# Patient Record
Sex: Female | Born: 1990
Health system: Southern US, Community
[De-identification: ages and names within clinical notes are randomized; demographics above are authoritative.]

## PROBLEM LIST (undated history)

## (undated) DIAGNOSIS — K811 Chronic cholecystitis: Secondary | ICD-10-CM

## (undated) DIAGNOSIS — N83202 Unspecified ovarian cyst, left side: Secondary | ICD-10-CM

## (undated) DIAGNOSIS — R748 Abnormal levels of other serum enzymes: Secondary | ICD-10-CM

## (undated) DIAGNOSIS — R51 Headache: Secondary | ICD-10-CM

## (undated) DIAGNOSIS — M542 Cervicalgia: Secondary | ICD-10-CM

## (undated) DIAGNOSIS — F419 Anxiety disorder, unspecified: Secondary | ICD-10-CM

## (undated) HISTORY — DX: Anxiety disorder, unspecified: F41.9

## (undated) HISTORY — DX: Cervicalgia: M54.2

## (undated) HISTORY — DX: Headache: R51

## (undated) HISTORY — DX: Chronic cholecystitis: K81.1

## (undated) HISTORY — DX: Abnormal levels of other serum enzymes: R74.8

## (undated) HISTORY — DX: Unspecified ovarian cyst, left side: N83.202

---
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
## 2012-01-30 ENCOUNTER — Telehealth: Payer: Self-pay | Admitting: Family Medicine

## 2012-01-30 NOTE — Telephone Encounter (Signed)
Letter put in front cabinet and message left on patients voicemail stating letter was ready to be picked up.

## 2012-03-27 ENCOUNTER — Ambulatory Visit (INDEPENDENT_AMBULATORY_CARE_PROVIDER_SITE_OTHER): Payer: 59

## 2012-03-27 DIAGNOSIS — Z23 Encounter for immunization: Secondary | ICD-10-CM

## 2012-03-27 NOTE — Progress Notes (Signed)
  Subjective:    Patient ID: Amber Mitchell, female    DOB: 1991-01-31, 21 y.o.   MRN: [id number]  HPI    Review of Systems     Objective:   Physical Exam        Assessment & Plan:  Patient came in for 1st HPV and Hep B injection. Patient tolerated both injections

## 2012-04-02 ENCOUNTER — Emergency Department
Admission: EM | Admit: 2012-04-02 | Discharge: 2012-04-02 | Disposition: A | Discharge status: Home / Self Care | Payer: 59 | Source: Home / Self Care | Attending: Family Medicine | Admitting: Family Medicine

## 2012-04-02 ENCOUNTER — Encounter: Payer: Self-pay | Admitting: *Deleted

## 2012-04-02 DIAGNOSIS — J069 Acute upper respiratory infection, unspecified: Secondary | ICD-10-CM

## 2012-04-02 DIAGNOSIS — J029 Acute pharyngitis, unspecified: Secondary | ICD-10-CM

## 2012-04-02 LAB — POCT RAPID STREP A (OFFICE): Rapid Strep A Screen: NEGATIVE

## 2012-04-02 MED ORDER — AZITHROMYCIN 250 MG PO TABS: ORAL_TABLET | ORAL | Status: AC

## 2012-04-02 MED ORDER — BENZONATATE 200 MG PO CAPS: 200.0000 mg | ORAL_CAPSULE | Freq: Every day | ORAL | Status: AC

## 2012-04-02 NOTE — ED Notes (Signed)
Patient c/o throat pain and right ear pain x yesterday.

## 2012-04-02 NOTE — ED Provider Notes (Signed)
History     CSN: [id number]  Arrival date & time 04/02/12  [facility]   First MD Initiated Contact with Patient 04/02/12 1954      Chief Complaint  Patient presents with  . Sore Throat  . Otalgia    right     HPI Comments: Patient complains of 1 day history of gradually progressive URI symptoms beginning with a mild sore throat, nasal congestion, and a cough.  Complains of fatigue but no myalgias.  Cough is generally non-productive.  There has been no pleuritic pain, shortness of breath, or wheezes.   The history is provided by the patient.    Past Medical History  Diagnosis Date  . Chicken pox as a child  . Preventative health care 12/26/2011  . Overweight 12/26/2011    History reviewed. No pertinent past surgical history.  Family History  Problem Relation Age of Onset  . Hypertension Maternal Grandmother     History  Substance Use Topics  . Smoking status: Never Smoker   . Smokeless tobacco: Never Used  . Alcohol Use: No    OB History    Grav Para Term Preterm Abortions TAB SAB Ect Mult Living                  Review of Systems + sore throat + cough No pleuritic pain No wheezing + nasal congestion + post-nasal drainage No sinus pain/pressure No itchy/red eyes + right earache No hemoptysis No SOB + fever, + chills No nausea No vomiting No abdominal pain No diarrhea No urinary symptoms No skin rashes + fatigue No myalgias + headache Used OTC meds without relief  Allergies  Review of patient's allergies indicates no known allergies.  Home Medications   Current Outpatient Rx  Name Route Sig Dispense Refill  . AZITHROMYCIN 250 MG PO TABS  Take 2 tabs today; then begin one tab once daily for 4 more days. 6 each 0    (Rx void after 04/10/12)  . BENZONATATE 200 MG PO CAPS Oral Take 1 capsule (200 mg total) by mouth at bedtime. Take as needed for cough 12 capsule 0    BP 115/78  Pulse 102  Temp(Src) 99.3 F (37.4 C) (Oral)  Resp 16  Ht 5' 7.75"  (1.721 m)  Wt 196 lb (88.905 kg)  BMI 30.02 kg/m2  SpO2 100%  Physical Exam Nursing notes and Vital Signs reviewed. Appearance:  Patient appears healthy, stated age, and in no acute distress.  Patient is obese (BMI 30.1)  Eyes:  Pupils are equal, round, and reactive to light and accomodation.  Extraocular movement is intact.  Conjunctivae are not inflamed  Ears:  Canals normal.  Tympanic membranes normal.  Nose:  Mildly congested turbinates.  No sinus tenderness.   Pharynx:  Normal Neck:  Supple.  Slightly tender shotty anterior/posterior nodes are palpated bilaterally  Lungs:  Clear to auscultation.  Breath sounds are equal.  Heart:  Regular rate and rhythm without murmurs, rubs, or gallops.  Abdomen:  Nontender without masses or hepatosplenomegaly.  Bowel sounds are present.  No CVA or flank tenderness.  Extremities:  No edema.  No calf tenderness Skin:  No rash present.   ED Course  Procedures  none   Labs Reviewed  POCT RAPID STREP A (OFFICE) negative  STREP A DNA PROBE pending      1. Acute pharyngitis   2. Acute upper respiratory infections of unspecified site       MDM  There is no evidence  of bacterial infection today.   Throat culture pending Throat culture pending.  Treat symptomatically for now  Prescription written for Benzonatate (Tessalon) to take at bedtime for night-time cough.  Take Mucinex D (guaifenesin with decongestant) twice daily for congestion.  Increase fluid intake, rest. May use Afrin nasal spray (or generic oxymetazoline) twice daily for about 5 days.  Also recommend using saline nasal spray several times daily and saline nasal irrigation (AYR is a common brand) Stop all antihistamines for now, and other non-prescription cough/cold preparations. May take Ibuprofen 200mg , 3 tabs every 8 hours with food for sore throat, headache, etc. Begin Azithromycin if throat culture positive, if not improving about 5 days or if persistent fever develops (Given  a prescription to hold, with an expiration date)  Follow-up with family doctor if not improving about 10 days.        Lattie Haw, MD 04/03/12 [phone]

## 2012-04-02 NOTE — Discharge Instructions (Signed)
Take Mucinex D (guaifenesin with decongestant) twice daily for congestion.  Increase fluid intake, rest. May use Afrin nasal spray (or generic oxymetazoline) twice daily for about 5 days.  Also recommend using saline nasal spray several times daily and saline nasal irrigation (AYR is a common brand) Stop all antihistamines for now, and other non-prescription cough/cold preparations. May take Ibuprofen 200mg , 3 tabs every 8 hours with food for sore throat, headache, etc. Begin Azithromycin if not improving about 5 days or if persistent fever develops. Follow-up with family doctor if not improving about 10 days.  Salt Water Gargle This solution will help make your mouth and throat feel better. HOME CARE INSTRUCTIONS   Mix 1 teaspoon of salt in 8 ounces of warm water.   Gargle with this solution as much or often as you need or as directed. Swish and gargle gently if you have any sores or wounds in your mouth.   Do not swallow this mixture.  Document Released: 07/27/2004 Document Revised: 10/12/2011 Document Reviewed: 12/18/2008 [facility] Patient Information 2012 [place], [place].

## 2012-04-05 ENCOUNTER — Telehealth: Payer: Self-pay | Admitting: *Deleted

## 2012-04-26 ENCOUNTER — Telehealth: Payer: Self-pay | Admitting: Family Medicine

## 2012-04-26 MED ORDER — AMOXICILLIN 500 MG PO TABS: 500.0000 mg | ORAL_TABLET | Freq: Three times a day (TID) | ORAL | Status: DC

## 2012-04-26 NOTE — Telephone Encounter (Signed)
Pt informed and rX sent

## 2012-04-26 NOTE — Telephone Encounter (Signed)
Afebrile. Parent states that child continues to have same s/s of ear fullness and congestion, cough and throat irritation. She finished Zpak she was given.  Parent states it would be difficult to bring her back into office. Parent became frustrated with triage questions. Message sent.

## 2012-04-26 NOTE — Telephone Encounter (Signed)
I have not seen this myself so it is hard for me to know the best medication to try. I am willing to try a 7 day course of Amoxicillin but if it is viral it will not help. Only time and rest will. If the Amoxicillin does not help I will need to see her. Amox 500 mg po tid x 7 days

## 2012-04-26 NOTE — Telephone Encounter (Signed)
Please advise 

## 2012-05-19 ENCOUNTER — Emergency Department
Admission: EM | Admit: 2012-05-19 | Discharge: 2012-05-19 | Disposition: A | Discharge status: Home / Self Care | Payer: 59 | Source: Home / Self Care

## 2012-05-19 DIAGNOSIS — R21 Rash and other nonspecific skin eruption: Secondary | ICD-10-CM

## 2012-05-19 NOTE — ED Provider Notes (Signed)
History     CSN: [id number]  Arrival date & time 05/19/12  1428   First MD Initiated Contact with Patient 05/19/12 1446      Chief Complaint  Patient presents with  . Flank Pain   HPI Comments: HPI  This patient complains of a RASH  Location: R lower abdomen   Onset: 1 day  Course: Had similar lesion near rib cage earlier in the week that self resolved.  Area was initially itchy,but then became tender.  Self-treated with: nothing  Improvement with treatment: n/a   History  Itching: yes  Tenderness: yes  New medications/antibiotics: no  Pet exposure: no  Recent travel or tropical exposure: no  New soaps, shampoos, detergent, clothing: no  Tick/insect exposure: no  Red Flags  Feeling ill: no  Fever: no  Facial/tongue swelling/difficulty breathing: no  Diabetic or immunocompromised: no  No dysuria, no increased urinary frequency, no hematuria.  No vaginal discharge.  No GI complaints.  No joint pain.  Pt does report chickenpox as a child.  No recent infections.      Patient is a 21 y.o. female presenting with rash. The history is provided by the patient.  Rash  There has been no fever.    Past Medical History  Diagnosis Date  . Chicken pox as a child  . Preventative health care 12/26/2011  . Overweight 12/26/2011    History reviewed. No pertinent past surgical history.  Family History  Problem Relation Age of Onset  . Hypertension Maternal Grandmother     History  Substance Use Topics  . Smoking status: Never Smoker   . Smokeless tobacco: Never Used  . Alcohol Use: No    OB History    Grav Para Term Preterm Abortions TAB SAB Ect Mult Living                  Review of Systems  Skin: Positive for rash.  All other systems reviewed and are negative.    Allergies  Review of patient's allergies indicates no known allergies.  Home Medications  No current outpatient prescriptions on file.  BP 111/76  Pulse 88  Temp 98.5 F (36.9 C) (Oral)   Resp 18  Ht 5\' 8"  (1.727 m)  Wt 209 lb (94.802 kg)  BMI 31.78 kg/m2  SpO2 98%  LMP 03/30/2012  Physical Exam  Constitutional: She appears well-developed and well-nourished.  HENT:  Head: Normocephalic and atraumatic.  Right Ear: External ear normal.  Left Ear: External ear normal.  Eyes: Pupils are equal, round, and reactive to light.  Neck: Normal range of motion. Neck supple.  Cardiovascular: Normal rate and regular rhythm.   Pulmonary/Chest: Effort normal and breath sounds normal.  Abdominal: Soft. Bowel sounds are normal.       Min LLQ tenderness around distribution of redness.   Musculoskeletal: Normal range of motion.  Neurological: She is alert.  Skin"    ED Course  Procedures (including critical care time)  Labs Reviewed - No data to display No results found.   No diagnosis found.    MDM  Symptoms are fairly mild, rash rather faint.  Ddx includes zoster, nonspecific dermatitis, dermatitis herpetiformis.  Pt with noted hx/o chickenpox as a child, pt also reports though a similar smaller faint lesion on R pelvic area-less likely zoster given contralateral rash distribution.  No GI complaints. No family hx/o GI disease- gluten related rash much less likely.  Given that sxs are very mild and previous rash was self  limited to 24-48 hours, will opt for watchful waiting.  Discussed general red flags at length including fever, chills, nausea, joint pain.  Benadryl for itching.  Handout given.  Follow up with PCP in 3-5 days.      The patient and/or caregiver has been counseled thoroughly with regard to treatment plan and/or medications prescribed including dosage, schedule, interactions, rationale for use, and possible side effects and they verbalize understanding. Diagnoses and expected course of recovery discussed and will return if not improved as expected or if the condition worsens. Patient and/or caregiver verbalized understanding.                Floydene Flock, MD 05/19/12 1639  Floydene Flock, MD 05/19/12 [phone]

## 2012-05-20 ENCOUNTER — Ambulatory Visit (INDEPENDENT_AMBULATORY_CARE_PROVIDER_SITE_OTHER): Payer: 59 | Admitting: Family Medicine

## 2012-05-20 ENCOUNTER — Encounter: Payer: Self-pay | Admitting: Family Medicine

## 2012-05-20 VITALS — BP 132/85 | HR 86 | Temp 98.7°F | Ht 68.0 in | Wt 208.0 lb

## 2012-05-20 DIAGNOSIS — Z23 Encounter for immunization: Secondary | ICD-10-CM

## 2012-05-20 DIAGNOSIS — R109 Unspecified abdominal pain: Secondary | ICD-10-CM

## 2012-05-20 DIAGNOSIS — L309 Dermatitis, unspecified: Secondary | ICD-10-CM

## 2012-05-20 DIAGNOSIS — L259 Unspecified contact dermatitis, unspecified cause: Secondary | ICD-10-CM

## 2012-05-20 DIAGNOSIS — L509 Urticaria, unspecified: Secondary | ICD-10-CM

## 2012-05-20 LAB — POCT URINALYSIS DIPSTICK
Blood, UA: NEGATIVE
Ketones, UA: NEGATIVE
Protein, UA: NEGATIVE
Spec Grav, UA: 1.02

## 2012-05-20 MED ORDER — TRIAMCINOLONE ACETONIDE 0.1 % EX CREA: TOPICAL_CREAM | Freq: Two times a day (BID) | CUTANEOUS | Status: DC | PRN

## 2012-05-20 NOTE — Assessment & Plan Note (Signed)
Unclear initiating insult but has now had 3 separate spots with edema, redness and itching which resolve with in 24 hour and then a different spot appears. She is encouraged to start a daily Antihistamine, use Triamcinolone cream prn and for the discomfort she feels after the lesions resolve she can try Aspercreme prn. Notify us if no resolution

## 2012-05-20 NOTE — ED Provider Notes (Signed)
Agree with exam, assessment, and plan.   Lattie Haw, MD 05/20/12 [phone]

## 2012-05-20 NOTE — Patient Instructions (Addendum)
Hives Hives (urticaria) are itchy, red, swollen patches on the skin. They may change size, shape, and location quickly and repeatedly. Hives that occur deeper in the skin can cause swelling of the hands, feet, and face. Hives may be an allergic reaction to something you or your child ate, touched, or put on the skin. Hives can also be a reaction to cold, heat, viral infections, medication, insect bites, or emotional stress. Often the cause is hard to find. Hives can come and go for several days to several weeks. Hives are not contagious. HOME CARE INSTRUCTIONS   If the cause of the hives is known, avoid exposure to that source.   To relieve itching and rash:   Apply cold compresses to the skin or take cool water baths. Do not take or give your child hot baths or showers because the warmth will make the itching worse.   The best medicine for hives is an antihistamine. An antihistamine will not cure hives, but it will reduce their severity. You can use an antihistamine available over the counter. This medicine may make your child sleepy. Teenagers should not drive while using this medicine.   Take or give an antihistamine every 6 hours until the hives are completely gone for 24 hours or as directed.   Your child may have other medications prescribed for itching. Give these as directed by your child's caregiver.   You or your child should wear loose fitting clothing, including undergarments. Skin irritations may make hives worse.   Follow-up as directed by your caregiver.  SEEK MEDICAL CARE IF:   You or your child still have considerable itching after taking the medication (prescribed or purchased over the counter).   Joint swelling or pain occurs.  SEEK IMMEDIATE MEDICAL CARE IF:   You have a fever.   Swollen lips or tongue are noticed.   There is difficulty with breathing, swallowing, or tightness in the throat or chest.   Abdominal pain develops.   Your child starts acting very  sick.  These may be the first signs of a life-threatening allergic reaction. THIS IS AN EMERGENCY. Call 911 for medical help. MAKE SURE YOU:   Understand these instructions.   Will watch your condition.   Will get help right away if you are not doing well or get worse.  Document Released: 10/23/2005 Document Revised: 10/12/2011 Document Reviewed: 06/12/2008 [facility] Patient Information 2012 [place], [place].  Try Zyrtec/Cetirizine 10 mg tab daily for next month if tolerated then as needed after that Get some Aspercreme based cream and apply 3 x daily as needed for pain, or take Aleve/Naproxen or Motrin/Advil/Ibuprofen for pain with food

## 2012-05-20 NOTE — Progress Notes (Signed)
Patient ID: Amber Mitchell, female   DOB: [birth year], 21 y.o.   MRN: [id number] Amber Mitchell [id number] 19-Mar-1991 05/20/2012      Progress Note-Follow Up  Subjective  Chief Complaint  Chief Complaint  Patient presents with  . Flank Pain    left sided pain- X 2 days, happened a couple of weeks ago and pain lasted 4-5 days    HPI  Patient is a 21 year old Caucasian female who is in today to discuss some rash and flank pain. She reports symptoms started just 2 days ago. She had a raised red itchy lesion on her left upper quadrant anteriorly then the rash resolved spontaneously she was left with a dull mild discomfort in the area for short time. That has since resolved she then developed a second lesion in the left lower quadrant with a similar pattern initial swollen red rash and itching rash resolved dull discomfort followed. She has since had a similar lesion on her right thigh which is also resolved. She denies any recent increased stressors, new clothes or products. She denies a previous history of hives and did not try any medications symptoms resolved on their. No chest pain no palpitations, recent illness, congestion or other concerns. No GI or GU complaints.  Past Medical History  Diagnosis Date  . Chicken pox as a child  . Preventative health care 12/26/2011  . Overweight 12/26/2011  . Urticaria 05/20/2012    History reviewed. No pertinent past surgical history.  Family History  Problem Relation Age of Onset  . Hypertension Maternal Grandmother     History   Social History  . Marital Status: Single    Spouse Name: N/A    Number of Children: N/A  . Years of Education: N/A   Occupational History  . Not on file.   Social History Main Topics  . Smoking status: Never Smoker   . Smokeless tobacco: Never Used  . Alcohol Use: No  . Drug Use: No  . Sexually Active: No   Other Topics Concern  . Not on file   Social History Narrative  . No narrative on file     No current outpatient prescriptions on file prior to visit.    No Known Allergies  Review of Systems  Review of Systems  Constitutional: Negative for fever and malaise/fatigue.  HENT: Negative for congestion.   Eyes: Negative for discharge.  Respiratory: Negative for shortness of breath.   Cardiovascular: Negative for chest pain, palpitations and leg swelling.  Gastrointestinal: Negative for nausea, abdominal pain and diarrhea.  Genitourinary: Negative for dysuria.  Musculoskeletal: Negative for falls.  Skin: Positive for itching and rash.       2 lesions on left flank and 1 on right thigh both resolved at this time  Neurological: Negative for loss of consciousness and headaches.  Endo/Heme/Allergies: Negative for polydipsia.  Psychiatric/Behavioral: Negative for depression and suicidal ideas. The patient is not nervous/anxious and does not have insomnia.     Objective  BP 132/85  Pulse 86  Temp 98.7 F (37.1 C) (Temporal)  Ht 5\' 8"  (1.727 m)  Wt 208 lb (94.348 kg)  BMI 31.63 kg/m2  SpO2 99%  LMP 03/30/2012  Physical Exam  Physical Exam  Constitutional: She is oriented to person, place, and time and well-developed, well-nourished, and in no distress. No distress.  HENT:  Head: Normocephalic and atraumatic.  Eyes: Conjunctivae are normal.  Neck: Neck supple. No thyromegaly present.  Cardiovascular: Normal rate, regular rhythm and normal heart  sounds.   No murmur heard. Pulmonary/Chest: Effort normal and breath sounds normal. She has no wheezes.  Abdominal: She exhibits no distension and no mass.  Musculoskeletal: She exhibits no edema.  Lymphadenopathy:    She has no cervical adenopathy.  Neurological: She is alert and oriented to person, place, and time.  Skin: Skin is warm and dry. No rash noted. She is not diaphoretic.  Psychiatric: Memory, affect and judgment normal.    No results found for this basename: TSH   Lab Results  Component Value Date    WBC 8.7 01/01/2010   HGB 12.5 01/01/2010   HCT 39.4 01/01/2010   MCV 82.3 01/01/2010   PLT 313 01/01/2010     Assessment & Plan  Urticaria Unclear initiating insult but has now had 3 separate spots with edema, redness and itching which resolve with in 24 hour and then a different spot appears. She is encouraged to start a daily Antihistamine, use Triamcinolone cream prn and for the discomfort she feels after the lesions resolve she can try Aspercreme prn. Notify us if no resolution

## 2012-06-01 ENCOUNTER — Encounter (HOSPITAL_BASED_OUTPATIENT_CLINIC_OR_DEPARTMENT_OTHER): Payer: Self-pay | Admitting: *Deleted

## 2012-06-01 ENCOUNTER — Emergency Department (HOSPITAL_BASED_OUTPATIENT_CLINIC_OR_DEPARTMENT_OTHER): Payer: 59

## 2012-06-01 ENCOUNTER — Emergency Department (HOSPITAL_BASED_OUTPATIENT_CLINIC_OR_DEPARTMENT_OTHER)
Admission: EM | Admit: 2012-06-01 | Discharge: 2012-06-02 | Disposition: A | Discharge status: Home / Self Care | Payer: 59 | Attending: Emergency Medicine | Admitting: Emergency Medicine

## 2012-06-01 DIAGNOSIS — R064 Hyperventilation: Secondary | ICD-10-CM | POA: Insufficient documentation

## 2012-06-01 DIAGNOSIS — R6883 Chills (without fever): Secondary | ICD-10-CM | POA: Insufficient documentation

## 2012-06-01 DIAGNOSIS — W57XXXA Bitten or stung by nonvenomous insect and other nonvenomous arthropods, initial encounter: Secondary | ICD-10-CM

## 2012-06-01 LAB — URINALYSIS, ROUTINE W REFLEX MICROSCOPIC
Glucose, UA: NEGATIVE mg/dL
Hgb urine dipstick: NEGATIVE
Leukocytes, UA: NEGATIVE
pH: 7 (ref 5.0–8.0)

## 2012-06-01 NOTE — ED Notes (Signed)
I met patient in triage after call from receptionist for help. Patient was in wheelchair stuttering and sobbing and shivering with complaint as being cold. Patient's boy friend and mother were at side. Staff asked mother and boy friend to wait until initial triage was completed. Patient was hyperventilating. Staff was able to calm patient, her breathing rate improved. Patient was calm and answering our questions, then mother and boyfriend burst into triage room and mother covered patient back up during my taking of vitals. Mother was in way of helping patient. Patient began to stutter and hyperventilate again in presence of her family.

## 2012-06-01 NOTE — ED Notes (Signed)
Pt. Reports chills starting less than an hour ago.  No fever in triage.  Pt. Has no noted nausea or vomiting and no diarrhea.  Pt. Is stuttering and crying.  Pt. Reports she can see normal but complains of headache at R temple.  Pt. Huffing and is not acting with behavior of a 21 yr old.

## 2012-06-01 NOTE — ED Provider Notes (Signed)
History   This chart was scribed for Niley Helbig Smitty Cords, MD by Shari Heritage. The patient was seen in room MH10/MH10. Patient's care was started at 2222.     CSN: [id number]  Arrival date & time 06/01/12  2222   First MD Initiated Contact with Patient 06/01/12 2258      Chief Complaint  Patient presents with  . Chills    starting approx. less than an hour ago per mother.    (Consider location/radiation/quality/duration/timing/severity/associated sxs/prior treatment) The history is provided by the patient, a parent and a friend. No language interpreter was used.   RAYEN PALEN is a 21 y.o. female who presents to the Emergency Department complaining of chills, then overheating and hyperventilation while talking to her boyfriend in the car about something this evening. Patient denies chest pain, fever, nausea, vomiting or diarrhea. Family states that she has had a rash on her side over a week ago that she saw her PMD for and it resolved. Patient denies any other sick contacts. Patient has never smoked.  No travel.  No camping, no tick exposure.  No sore throat.  No abdominal pain.  Mother is concerned that Hep B vaccine and Gardasil vaccine patient had several weeks ago could have caused this.  Past Medical History  Diagnosis Date  . Chicken pox as a child  . Preventative health care 12/26/2011  . Overweight 12/26/2011  . Urticaria 05/20/2012    No past surgical history on file.  Family History  Problem Relation Age of Onset  . Hypertension Maternal Grandmother     History  Substance Use Topics  . Smoking status: Never Smoker   . Smokeless tobacco: Never Used  . Alcohol Use: No    OB History    Grav Para Term Preterm Abortions TAB SAB Ect Mult Living                  Review of Systems  Constitutional: Negative for fever, diaphoresis, appetite change and fatigue.  HENT: Negative for ear pain, congestion, sore throat, rhinorrhea, sneezing, neck pain, neck stiffness  and voice change.   Respiratory: Negative for cough and shortness of breath.   Cardiovascular: Negative for chest pain.  Gastrointestinal: Negative for nausea, vomiting, abdominal pain and diarrhea.  Genitourinary: Negative for dysuria.  Hematological: Negative for adenopathy. Does not bruise/bleed easily.  All other systems reviewed and are negative.   A complete 10 system review of systems was obtained and all systems are negative except as noted in the HPI and PMH.   Allergies  Review of patient's allergies indicates no known allergies.  Home Medications   Current Outpatient Rx  Name Route Sig Dispense Refill  . ACETAMINOPHEN 500 MG PO TABS Oral Take 1,000 mg by mouth every 6 (six) hours as needed. For pain.    Marland Kitchen CETIRIZINE HCL 5 MG PO TABS Oral Take 10 mg by mouth daily.     Marland Kitchen NAPROXEN SODIUM 220 MG PO TABS Oral Take 440 mg by mouth 2 (two) times daily with a meal. For foot pain.    . TRIAMCINOLONE ACETONIDE 0.1 % EX CREA Topical Apply topically 2 (two) times daily as needed. Rash, itching 30 g 0    BP 139/79  Pulse 110  Temp 98.4 F (36.9 C) (Oral)  Resp 36  Ht 5\' 8"  (1.727 m)  Wt 208 lb (94.348 kg)  BMI 31.63 kg/m2  SpO2 100%  LMP 05/30/2012  Physical Exam  Constitutional: She is oriented to person,  place, and time. She appears well-developed and well-nourished.  HENT:  Head: Normocephalic and atraumatic.  Right Ear: Tympanic membrane normal. No mastoid tenderness. Tympanic membrane is not injected.  Left Ear: Tympanic membrane normal. No mastoid tenderness. Tympanic membrane is not injected.  Mouth/Throat: Uvula is midline and oropharynx is clear and moist. No oropharyngeal exudate.  Eyes: Conjunctivae and EOM are normal. Pupils are equal, round, and reactive to light.  Neck: Normal range of motion. Neck supple. No tracheal deviation present. No thyromegaly present.       No LAN of the neck supraclavicular fossa nor of the axilla  Cardiovascular: Normal rate and  regular rhythm.   Pulmonary/Chest: Effort normal and breath sounds normal. No stridor. No respiratory distress. She has no wheezes. She has no rhonchi. She has no rales.  Abdominal: Soft. Bowel sounds are normal. There is no tenderness. There is no rebound and no guarding.  Musculoskeletal: Normal range of motion. She exhibits no edema and no tenderness.  Lymphadenopathy:    She has no cervical adenopathy.  Neurological: She is alert and oriented to person, place, and time. She has normal reflexes. She displays normal reflexes. No cranial nerve deficit.  Skin: Skin is warm and dry. No rash noted.       3 insect bites on the dorsum of the left foot, no infection  Psychiatric: Her mood appears anxious. Her speech is rapid and/or pressured.    ED Course  Procedures (including critical care time) DIAGNOSTIC STUDIES: Oxygen Saturation is 100% on room air, normal by my interpretation.    COORDINATION OF CARE: 11:10pm- Patient informed of current plan for treatment and evaluation and agrees with plan at this time. Have ordered a pregnancy test, urinalysis and chest x-ray.  Results for orders placed during the hospital encounter of 06/01/12  URINALYSIS, ROUTINE W REFLEX MICROSCOPIC      Component Value Range   Color, Urine YELLOW  YELLOW   APPearance CLEAR  CLEAR   Specific Gravity, Urine 1.023  1.005 - 1.030   pH 7.0  5.0 - 8.0   Glucose, UA NEGATIVE  NEGATIVE mg/dL   Hgb urine dipstick NEGATIVE  NEGATIVE   Bilirubin Urine NEGATIVE  NEGATIVE   Ketones, ur NEGATIVE  NEGATIVE mg/dL   Protein, ur NEGATIVE  NEGATIVE mg/dL   Urobilinogen, UA 0.2  0.0 - 1.0 mg/dL   Nitrite NEGATIVE  NEGATIVE   Leukocytes, UA NEGATIVE  NEGATIVE  PREGNANCY, URINE      Component Value Range   Preg Test, Ur NEGATIVE  NEGATIVE    No results found.   No diagnosis found.  PERC negative  MDM  No tick nor travel exposure.  Exam and vitals reassuring, no indication for labs at this time.  Family is concerned  this is related to Hep B or gardasil vaccine.  Vaccinations given several weeks ago with no reaction at the time.  Do not think this is related to vaccinations.  Patient with feeling of chills and felt hot and hyperventilating while talking to boyfriend in the care.  Suspect anxiety related phenomenon but hot flashes may have medication component in steroid cream.  Patient advised to return for fevers, chest pain, shortness of breath, rashes or any concerns.  Advised to stay away from stressful situations and drink lots of fluids and stop triamcinolone and follow up with PMD      I personally performed the services described in this documentation, which was scribed in my presence. The recorded information has been  reviewed and considered.   Jasmine Awe, MD 06/02/12 [phone]

## 2012-06-02 LAB — PREGNANCY, URINE: Preg Test, Ur: NEGATIVE

## 2012-06-02 MED ORDER — TRAMADOL HCL 50 MG PO TABS
50.0000 mg | ORAL_TABLET | Freq: Once | ORAL | Status: AC
  Administered 2012-06-02: 50 mg via ORAL
  Filled 2012-06-02: qty 1

## 2012-06-02 NOTE — ED Notes (Signed)
MD at bedside. 

## 2012-06-02 NOTE — ED Notes (Signed)
Pt. Denies any c/p, sob resp even and unlabored at d/c home.

## 2012-06-02 NOTE — ED Notes (Addendum)
Returned from [employer]. Pt. States she feels better at present. Still with h/a,but breathing has improved. Call bell within reach. Speech remains clear. resp even and unlabored.

## 2012-06-02 NOTE — ED Notes (Signed)
Patient transported to X-ray 

## 2012-06-03 ENCOUNTER — Encounter: Payer: Self-pay | Admitting: Family Medicine

## 2012-06-03 ENCOUNTER — Ambulatory Visit (INDEPENDENT_AMBULATORY_CARE_PROVIDER_SITE_OTHER): Payer: 59 | Admitting: Family Medicine

## 2012-06-03 VITALS — BP 115/73 | HR 96 | Temp 98.0°F | Resp 16 | Ht 68.25 in | Wt 207.2 lb

## 2012-06-03 DIAGNOSIS — Z0000 Encounter for general adult medical examination without abnormal findings: Secondary | ICD-10-CM

## 2012-06-03 DIAGNOSIS — R51 Headache: Secondary | ICD-10-CM

## 2012-06-03 DIAGNOSIS — M542 Cervicalgia: Secondary | ICD-10-CM

## 2012-06-03 DIAGNOSIS — F419 Anxiety disorder, unspecified: Secondary | ICD-10-CM

## 2012-06-03 DIAGNOSIS — R21 Rash and other nonspecific skin eruption: Secondary | ICD-10-CM

## 2012-06-03 DIAGNOSIS — L509 Urticaria, unspecified: Secondary | ICD-10-CM

## 2012-06-03 DIAGNOSIS — D649 Anemia, unspecified: Secondary | ICD-10-CM

## 2012-06-03 DIAGNOSIS — E538 Deficiency of other specified B group vitamins: Secondary | ICD-10-CM

## 2012-06-03 DIAGNOSIS — R110 Nausea: Secondary | ICD-10-CM

## 2012-06-03 DIAGNOSIS — F411 Generalized anxiety disorder: Secondary | ICD-10-CM

## 2012-06-03 LAB — LIPID PANEL
Cholesterol: 156 mg/dL (ref 0–200)
LDL Cholesterol: 90 mg/dL (ref 0–99)
Triglycerides: 98 mg/dL (ref 0.0–149.0)
VLDL: 19.6 mg/dL (ref 0.0–40.0)

## 2012-06-03 LAB — RENAL FUNCTION PANEL
Albumin: 4.5 g/dL (ref 3.5–5.2)
CO2: 27 mEq/L (ref 19–32)
Calcium: 9.5 mg/dL (ref 8.4–10.5)
Chloride: 102 mEq/L (ref 96–112)
Potassium: 4 mEq/L (ref 3.5–5.1)

## 2012-06-03 LAB — HEPATIC FUNCTION PANEL
ALT: 16 U/L (ref 0–35)
AST: 21 U/L (ref 0–37)
Alkaline Phosphatase: 63 U/L (ref 39–117)
Bilirubin, Direct: 0 mg/dL (ref 0.0–0.3)
Total Protein: 7.8 g/dL (ref 6.0–8.3)

## 2012-06-03 LAB — CBC
Hemoglobin: 12.2 g/dL (ref 12.0–15.0)
Platelets: 323 10*3/uL (ref 150.0–400.0)
RBC: 4.47 Mil/uL (ref 3.87–5.11)

## 2012-06-03 MED ORDER — IBUPROFEN 200 MG PO TABS: 400.0000 mg | ORAL_TABLET | Freq: Three times a day (TID) | ORAL | Status: AC | PRN

## 2012-06-03 MED ORDER — ALPRAZOLAM 0.25 MG PO TABS: 0.2500 mg | ORAL_TABLET | Freq: Two times a day (BID) | ORAL | Status: AC | PRN

## 2012-06-03 MED ORDER — PROMETHAZINE HCL 25 MG PO TABS: 25.0000 mg | ORAL_TABLET | Freq: Three times a day (TID) | ORAL | Status: DC | PRN

## 2012-06-03 NOTE — Patient Instructions (Addendum)

## 2012-06-04 ENCOUNTER — Encounter: Payer: Self-pay | Admitting: Family Medicine

## 2012-06-04 DIAGNOSIS — F419 Anxiety disorder, unspecified: Secondary | ICD-10-CM

## 2012-06-04 HISTORY — DX: Anxiety disorder, unspecified: F41.9

## 2012-06-04 NOTE — Progress Notes (Signed)
Patient ID: Amber Mitchell, female   DOB: 01/18/1991, 21 y.o.   MRN: [id number] EVANIE BUCKLE [id number] 04-Aug-1991 06/04/2012      Progress Note-Follow Up  Subjective  Chief Complaint  Chief Complaint  Patient presents with  . Follow-up    ED at [facility] 07.27.13 [Hyperventilation, w/chills & vertigo][    HPI  Patient is a 21 year old Caucasian female who is here today for hospital followup. She presented to the ER the other day with shortness of breath and hyperventilation, dizzy sensation of tremulousness tingling in her fingers and palpitations. She had suffered a but also admits to being under a great deal of stress. Workup in the ER was unremarkable and she's not had an episode since then although she does have occasional headaches and lightheaded sensation. She has pain and itching as well as some rash on her left foot where she suffered her blood bite. No obvious fevers. Does have some myalgias and malaise as well as generalized headache  Past Medical History  Diagnosis Date  . Chicken pox as a child  . Preventative health care 12/26/2011  . Overweight 12/26/2011  . Urticaria 05/20/2012  . Anxiety 06/04/2012    No past surgical history on file.  Family History  Problem Relation Age of Onset  . Hypertension Maternal Grandmother     History   Social History  . Marital Status: Single    Spouse Name: N/A    Number of Children: N/A  . Years of Education: N/A   Occupational History  . Not on file.   Social History Main Topics  . Smoking status: Never Smoker   . Smokeless tobacco: Never Used  . Alcohol Use: No  . Drug Use: No  . Sexually Active: No   Other Topics Concern  . Not on file   Social History Narrative  . No narrative on file    Current Outpatient Prescriptions on File Prior to Visit  Medication Sig Dispense Refill  . acetaminophen (TYLENOL) 500 MG tablet Take 1,000 mg by mouth every 6 (six) hours as needed. For pain.      . naproxen sodium  (ANAPROX) 220 MG tablet Take 440 mg by mouth 2 (two) times daily as needed. For foot pain.      Marland Kitchen triamcinolone cream (KENALOG) 0.1 % Apply topically 2 (two) times daily as needed. Rash, itching  30 g  0  . promethazine (PHENERGAN) 25 MG tablet Take 1 tablet (25 mg total) by mouth every 8 (eight) hours as needed for nausea.  20 tablet  0    No Known Allergies  Review of Systems  Review of Systems  Constitutional: Positive for chills and malaise/fatigue. Negative for fever.  HENT: Negative for congestion.   Eyes: Negative for discharge.  Respiratory: Positive for shortness of breath.   Cardiovascular: Positive for palpitations. Negative for chest pain and leg swelling.  Gastrointestinal: Negative for nausea, abdominal pain and diarrhea.  Genitourinary: Negative for dysuria.  Musculoskeletal: Negative for falls.  Skin: Negative for rash.  Neurological: Positive for dizziness, tingling and headaches. Negative for loss of consciousness.       In fingers  Endo/Heme/Allergies: Negative for polydipsia.  Psychiatric/Behavioral: Negative for depression and suicidal ideas. The patient is nervous/anxious. The patient does not have insomnia.     Objective  BP 115/73  Pulse 96  Temp 98 F (36.7 C) (Oral)  Resp 16  Ht 5' 8.25" (1.734 m)  Wt 207 lb 4 oz (94.008 kg)  BMI 31.28 kg/m2  SpO2 96%  LMP 05/30/2012  Physical Exam  Physical Exam  Constitutional: She is oriented to person, place, and time and well-developed, well-nourished, and in no distress. No distress.  HENT:  Head: Normocephalic and atraumatic.  Eyes: Conjunctivae are normal.  Neck: Neck supple. No thyromegaly present.  Cardiovascular: Normal rate, regular rhythm and normal heart sounds.   No murmur heard. Pulmonary/Chest: Effort normal and breath sounds normal. She has no wheezes.  Abdominal: She exhibits no distension and no mass.  Musculoskeletal: She exhibits no edema.  Lymphadenopathy:    She has no cervical  adenopathy.  Neurological: She is alert and oriented to person, place, and time.  Skin: Skin is warm and dry. No rash noted. She is not diaphoretic.  Psychiatric: Memory, affect and judgment normal.    Lab Results  Component Value Date   TSH 1.34 06/03/2012   Lab Results  Component Value Date   WBC 9.4 06/03/2012   HGB 12.2 06/03/2012   HCT 37.0 06/03/2012   MCV 82.8 06/03/2012   PLT 323.0 06/03/2012   Lab Results  Component Value Date   CREATININE 0.7 06/03/2012   BUN 12 06/03/2012   NA 138 06/03/2012   K 4.0 06/03/2012   CL 102 06/03/2012   CO2 27 06/03/2012   Lab Results  Component Value Date   ALT 16 06/03/2012   AST 21 06/03/2012   ALKPHOS 63 06/03/2012   BILITOT 0.3 06/03/2012   Lab Results  Component Value Date   CHOL 156 06/03/2012   Lab Results  Component Value Date   HDL 46.20 06/03/2012   Lab Results  Component Value Date   LDLCALC 90 06/03/2012   Lab Results  Component Value Date   TRIG 98.0 06/03/2012   Lab Results  Component Value Date   CHOLHDL 3 06/03/2012     Assessment & Plan  Anxiety Given Alprazolam to use prn and we will reevaluate at next visit. Describing symptoms of an anxiety attack, she accepts this possibility and we will consider a daily medication as needed  Preventative health care Fasting labs all wnl  NECK PAIN No c/o  Urticaria No further episodes, did have a recent insect bite with headache, Lyme titer negative

## 2012-06-04 NOTE — Assessment & Plan Note (Signed)
Fasting labs all wnl

## 2012-06-04 NOTE — Assessment & Plan Note (Signed)
No c/o

## 2012-06-04 NOTE — Assessment & Plan Note (Addendum)
Given Alprazolam to use prn and we will reevaluate at next visit. Describing symptoms of an anxiety attack, she accepts this possibility and we will consider a daily medication as needed

## 2012-06-04 NOTE — Assessment & Plan Note (Signed)
No further episodes, did have a recent insect bite with headache, Lyme titer negative

## 2012-06-17 ENCOUNTER — Ambulatory Visit (INDEPENDENT_AMBULATORY_CARE_PROVIDER_SITE_OTHER): Payer: 59 | Admitting: Family Medicine

## 2012-06-17 ENCOUNTER — Encounter: Payer: Self-pay | Admitting: Family Medicine

## 2012-06-17 VITALS — BP 128/82 | HR 90 | Temp 97.2°F | Ht 68.0 in | Wt 207.4 lb

## 2012-06-17 DIAGNOSIS — R519 Headache, unspecified: Secondary | ICD-10-CM | POA: Insufficient documentation

## 2012-06-17 DIAGNOSIS — R42 Dizziness and giddiness: Secondary | ICD-10-CM

## 2012-06-17 DIAGNOSIS — R51 Headache: Secondary | ICD-10-CM

## 2012-06-17 DIAGNOSIS — L509 Urticaria, unspecified: Secondary | ICD-10-CM

## 2012-06-17 DIAGNOSIS — F411 Generalized anxiety disorder: Secondary | ICD-10-CM

## 2012-06-17 DIAGNOSIS — F419 Anxiety disorder, unspecified: Secondary | ICD-10-CM

## 2012-06-17 MED ORDER — BUTALBITAL-ACETAMINOPHEN 50-325 MG PO TABS: 1.0000 | ORAL_TABLET | Freq: Three times a day (TID) | ORAL | Status: DC | PRN

## 2012-06-17 NOTE — Patient Instructions (Addendum)

## 2012-06-17 NOTE — Progress Notes (Signed)
Patient ID: Amber Mitchell, female   DOB: [birth year], 21 y.o.   MRN: [id number] VEOLA CAFARO [id number] 01/02/1991 06/17/2012      Progress Note-Follow Up  Subjective  Chief Complaint  Chief Complaint  Patient presents with  . Follow-up    2 week     HPI  Is a 36 Caucasian female who is in today with persistent complaints of headaches, vertigo and urticaria. At her last visit she was having somewhat frequent episodes of urticaria with raised itchy red lesions. We started her on Zyrtec 10 daily she's had only 1 episode in the last 2 weeks which occurred yesterday. She took a dose of Benadryl and the symptoms resolved. Her bigger complaint is persistent headache. She said she had a daily headache in 5-7/10 level since she was last seen. She denies photophobia or phonophobia although she has had migraine headaches in the past and was struggling with that set of symptoms 2-3 years at that time she tried Excedrin Migraine and Advil without any benefit. Now she did take some Advil she does get some help to improve the headache returns. Over the last couple of days to weeks she's noted that the pain is more concentrated in the left parietal region. No tinnitus. She is complaining still of symptoms of vertigo. She says there is no correlation with change of position or activity. She can be standing sitting lying. The room starts distention. No nausea or vomiting occurs as a result. Occurs randomly and stops within 5-20 minutes. She also complains of some other separate episodes of seeing things and what she describes as tunnel vision. As if everything was a poorly and a tunneling coming to a head. No other neurologic complaints and those episodes happen a couple times a week and her new. No fevers, chills, congestion , ear pain, throat pain, CP, palpitations, shortness of breath, GI or GU complaints. She has not tried the alprazolam given at her last visit   Past Medical History  Diagnosis Date  .  Chicken pox as a child  . Preventative health care 12/26/2011  . Overweight 12/26/2011  . Urticaria 05/20/2012  . Anxiety 06/04/2012  . Headache 06/17/2012  . Vertigo 06/17/2012    No past surgical history on file.  Family History  Problem Relation Age of Onset  . Hypertension Maternal Grandmother     History   Social History  . Marital Status: Single    Spouse Name: N/A    Number of Children: N/A  . Years of Education: N/A   Occupational History  . Not on file.   Social History Main Topics  . Smoking status: Never Smoker   . Smokeless tobacco: Never Used  . Alcohol Use: No  . Drug Use: No  . Sexually Active: No   Other Topics Concern  . Not on file   Social History Narrative  . No narrative on file    Current Outpatient Prescriptions on File Prior to Visit  Medication Sig Dispense Refill  . acetaminophen (TYLENOL) 500 MG tablet Take 1,000 mg by mouth every 6 (six) hours as needed. For pain.      Marland Kitchen ALPRAZolam (XANAX) 0.25 MG tablet Take 1 tablet (0.25 mg total) by mouth 2 (two) times daily as needed for sleep or anxiety.  20 tablet  1  . cetirizine (ZYRTEC) 10 MG tablet Take 10 mg by mouth daily.      . naproxen sodium (ANAPROX) 220 MG tablet Take 440 mg by mouth 2 (  two) times daily as needed. For foot pain.      Marland Kitchen triamcinolone cream (KENALOG) 0.1 % Apply topically 2 (two) times daily as needed. Rash, itching  30 g  0    No Known Allergies  Review of Systems  Review of Systems  Constitutional: Negative for fever and malaise/fatigue.  HENT: Negative for ear pain, congestion and tinnitus.   Eyes: Positive for blurred vision. Negative for double vision, photophobia and discharge.       Describes episodes of tunnel vision occuring randomly a couple times a week for the past couple weeks, no activity seems to bring them on  Respiratory: Negative for shortness of breath.   Cardiovascular: Negative for chest pain, palpitations and leg swelling.  Gastrointestinal:  Negative for nausea, abdominal pain and diarrhea.  Genitourinary: Negative for dysuria.  Musculoskeletal: Negative for falls.  Skin: Positive for itching and rash.  Neurological: Positive for dizziness and headaches. Negative for loss of consciousness.  Endo/Heme/Allergies: Negative for polydipsia.  Psychiatric/Behavioral: Negative for depression and suicidal ideas. The patient is not nervous/anxious and does not have insomnia.     Objective  BP 128/82  Pulse 90  Temp 97.2 F (36.2 C) (Temporal)  Ht 5\' 8"  (1.727 m)  Wt 207 lb 6.4 oz (94.076 kg)  BMI 31.54 kg/m2  SpO2 97%  LMP 05/30/2012  Physical Exam  Physical Exam  Constitutional: She is oriented to person, place, and time and well-developed, well-nourished, and in no distress. No distress.  HENT:  Head: Normocephalic and atraumatic.  Eyes: Conjunctivae are normal.  Neck: Neck supple. No thyromegaly present.  Cardiovascular: Normal rate, regular rhythm and normal heart sounds.   No murmur heard. Pulmonary/Chest: Effort normal and breath sounds normal. She has no wheezes.  Abdominal: Soft. Bowel sounds are normal. She exhibits no distension and no mass.  Musculoskeletal: She exhibits no edema.  Lymphadenopathy:    She has no cervical adenopathy.  Neurological: She is alert and oriented to person, place, and time. She displays normal reflexes. No cranial nerve deficit. She exhibits normal muscle tone. Gait normal. Coordination normal. GCS score is 15.  Skin: Skin is warm and dry. No rash noted. She is not diaphoretic.  Psychiatric: Memory, affect and judgment normal.    Lab Results  Component Value Date   TSH 1.34 06/03/2012   Lab Results  Component Value Date   WBC 9.4 06/03/2012   HGB 12.2 06/03/2012   HCT 37.0 06/03/2012   MCV 82.8 06/03/2012   PLT 323.0 06/03/2012   Lab Results  Component Value Date   CREATININE 0.7 06/03/2012   BUN 12 06/03/2012   NA 138 06/03/2012   K 4.0 06/03/2012   CL 102 06/03/2012   CO2 27  06/03/2012   Lab Results  Component Value Date   ALT 16 06/03/2012   AST 21 06/03/2012   ALKPHOS 63 06/03/2012   BILITOT 0.3 06/03/2012   Lab Results  Component Value Date   CHOL 156 06/03/2012   Lab Results  Component Value Date   HDL 46.20 06/03/2012   Lab Results  Component Value Date   LDLCALC 90 06/03/2012   Lab Results  Component Value Date   TRIG 98.0 06/03/2012   Lab Results  Component Value Date   CHOLHDL 3 06/03/2012     Assessment & Plan  Headache Patient reports having trouble with migraines 2-3 years ago. At that time she has photophobia and phonophobia he did not respond to Excedrin Migraine or Advil. She reports  those results we'll then over the last couple of months she's had increasing and now daily headaches. She describes her headaches as diffuse but over the last one to 2 weeks the pain is more in the left parietal region. She reports the pain is 5-7 consistently. No photophobia or phonophobia at this time no nausea. She's also describing other episodes of what she describes as tunnel vision where she has trouble seeing things in the distance and still other episodes of true vertigo or spinning. She said these episodes will occur in the late. She is highly anxious about her numerous conditions at this time we will refer her to neurology for further workup. She is given some butalbital 50 acetaminophen 325 tabs to use one 3 times a day as needed for headaches and may alternate this with profound. She was also reeducated on the need for small frequent meals with lean proteins plenty of hydration and adequate sleep  Anxiety She denies being anxious and has not tried the alprazolam she was given at the last visit. Denies depression  Urticaria She has had one episode of slightly itchy red raised spots on her arms which occurred yesterday since her last visit. She did start cetirizine 10 mg tablet on a daily basis and does note that she's had no other outbreaks since then.  Yesterday when she had her one occurrence she took a dose of Benadryl and it seemed to resolve. She said the initial lesion was raised itchy and then became mildly tender to the touch and uncomfortable for a short time before disappearing. Concern is for atopic dermatitis from some recurrent insult in her environment versus an anxiety urticarial reaction. She is offered referral to dermatology versus for allergy testing and will let us know if she would like to proceed with one of the options. For now continue Zyrtec daily and Benadryl as needed.

## 2012-06-17 NOTE — Assessment & Plan Note (Signed)
She denies being anxious and has not tried the alprazolam she was given at the last visit. Denies depression

## 2012-06-17 NOTE — Assessment & Plan Note (Signed)
She has had one episode of slightly itchy red raised spots on her arms which occurred yesterday since her last visit. She did start cetirizine 10 mg tablet on a daily basis and does note that she's had no other outbreaks since then. Yesterday when she had her one occurrence she took a dose of Benadryl and it seemed to resolve. She said the initial lesion was raised itchy and then became mildly tender to the touch and uncomfortable for a short time before disappearing. Concern is for atopic dermatitis from some recurrent insult in her environment versus an anxiety urticarial reaction. She is offered referral to dermatology versus for allergy testing and will let us know if she would like to proceed with one of the options. For now continue Zyrtec daily and Benadryl as needed.

## 2012-06-17 NOTE — Assessment & Plan Note (Signed)
Patient reports having trouble with migraines 2-3 years ago. At that time she has photophobia and phonophobia he did not respond to Excedrin Migraine or Advil. She reports those results we'll then over the last couple of months she's had increasing and now daily headaches. She describes her headaches as diffuse but over the last one to 2 weeks the pain is more in the left parietal region. She reports the pain is 5-7 consistently. No photophobia or phonophobia at this time no nausea. She's also describing other episodes of what she describes as tunnel vision where she has trouble seeing things in the distance and still other episodes of true vertigo or spinning. She said these episodes will occur in the late. She is highly anxious about her numerous conditions at this time we will refer her to neurology for further workup. She is given some butalbital 50 acetaminophen 325 tabs to use one 3 times a day as needed for headaches and may alternate this with profound. She was also reeducated on the need for small frequent meals with lean proteins plenty of hydration and adequate sleep

## 2012-06-20 ENCOUNTER — Encounter: Payer: Self-pay | Admitting: Neurology

## 2012-06-27 ENCOUNTER — Ambulatory Visit (INDEPENDENT_AMBULATORY_CARE_PROVIDER_SITE_OTHER): Payer: 59 | Admitting: Neurology

## 2012-06-27 ENCOUNTER — Encounter: Payer: Self-pay | Admitting: Neurology

## 2012-06-27 VITALS — BP 118/78 | HR 92 | Wt 208.0 lb

## 2012-06-27 DIAGNOSIS — G43909 Migraine, unspecified, not intractable, without status migrainosus: Secondary | ICD-10-CM

## 2012-06-27 DIAGNOSIS — R51 Headache: Secondary | ICD-10-CM

## 2012-06-27 MED ORDER — METHYLPREDNISOLONE 4 MG PO KIT: PACK | ORAL | Status: AC

## 2012-06-27 NOTE — Patient Instructions (Addendum)
1.  Get visit with ophthalmologist 2.  Keep headache diary 3.  Stop your OTC medication for headache  Your MRI is scheduled for Tuesday, August 27 at 9:00am.  Please arrive to [facility], first floor admitting by 8:45am.  [phone].  We will call you to schedule your follow up appointment with Dr. Arbutus Leas.

## 2012-06-27 NOTE — Progress Notes (Signed)
Subjective:    Amber Mitchell is a 21 y.o. female who comes in for neurologic consultation at the request of Dr. Abner Greenspan.  The request is for the evaluation of headache. They patient is accompanied by her mother who supplements the history.   The patient identifies herself as a person who normally gets headache.  It has been going on for about a year.    When she gets the headache it is L sided initially and then radiates holocephalic  There is always a dull headache in the background but the throbbing lasts for about an hour.  She has photophobia but no phonophobia.  There is occasional rare nausea but never emesis.  She will take Tylenol or advil for her headaches.  She takes something daily for her headache and has done that for the past year.  She states that it works about 50% of the time.  There is no association with menstrual cycle or foods.    She reports that she had a spell about a month ago.  She had a HA all day and ended up becoming very chilled, and did not seem that she could get warmed.  She went to the ER in [place] because her mother noting that she was stuttering and hyperventilating.  She was evaluated and d/c from the ER and f/u with her PCP.  She had thryroid testing and BS testing that was normal.  She has never had neuroimaging.  She has associated dizziness (spinning sensation) with the headache.  She was given a RX for fiorinal but has not yet had that filled.  Her last ophthalmologic exam was at least 2 years ago.    No Known Allergies Current Outpatient Prescriptions on File Prior to Visit  Medication Sig Dispense Refill  . acetaminophen (TYLENOL) 500 MG tablet Take 1,000 mg by mouth every 6 (six) hours as needed. For pain.      Marland Kitchen ALPRAZolam (XANAX) 0.25 MG tablet Take 1 tablet (0.25 mg total) by mouth 2 (two) times daily as needed for sleep or anxiety.  20 tablet  1  . cetirizine (ZYRTEC) 10 MG tablet Take 10 mg by mouth daily.      . naproxen sodium (ANAPROX) 220 MG  tablet Take 440 mg by mouth 2 (two) times daily as needed. For foot pain.      Marland Kitchen triamcinolone cream (KENALOG) 0.1 % Apply topically 2 (two) times daily as needed. Rash, itching  30 g  0   Past Medical History  Diagnosis Date  . Chicken pox as a child  . Preventative health care 12/26/2011  . Overweight 12/26/2011  . Urticaria 05/20/2012  . Anxiety 06/04/2012  . Headache 06/17/2012  . Vertigo 06/17/2012   Past Surgical History  Procedure Date  . No surgical history Aug 2013   History   Social History  . Marital Status: Single    Spouse Name: N/A    Number of Children: N/A  . Years of Education: N/A   Occupational History  . Not on file.   Social History Main Topics  . Smoking status: Never Smoker   . Smokeless tobacco: Never Used  . Alcohol Use: No  . Drug Use: No  . Sexually Active: No   Other Topics Concern  . Not on file   Social History Narrative  . No narrative on file   Family History  Problem Relation Age of Onset  . Hypertension Maternal Grandmother   . Migraines Maternal Grandmother  Review of Systems She has noted occasional morning rash that itches for an hour.  The rash will last about 24 hours.  She saw her PCP and was told that she may need an allergy physician.  A complete 10 system ROS was obtained and was unremarkable except as above.    Objective:    BP 118/78  Pulse 92  Wt 208 lb (94.348 kg)  LMP 05/30/2012  HEENT:  Normocephalic, atraumatic. The mucous membranes are moist. The superficial temporal arteries are without ropiness or tenderness. Cardiovascular: Regular rate and rhythm. Lungs: Clear to auscultation bilaterally. Neck: There are no carotid bruits noted bilaterally.  NEUROLOGICAL:  Orientation:  The patient is alert and oriented x 3.  Fund of knowledge is appropriate.  She appears anxious. Cranial nerves: There is good facial symmetry. The pupils are equal round and reactive to light bilaterally. Funduscopic exam is  attempted but the disc margins were not well visualized bilaterally. Extraocular muscles are intact and visual fields are full to confrontational testing. Speech is fluent and clear. Soft palate rises symmetrically and there is no tongue deviation. Hearing is intact to conversational tone. Tone: Tone is good throughout. Sensation: Sensation is intact to light touch and pinprick throughout. Vibration is intact at the bilateral big toe. There is no extinction with double simultaneous stimulation. There is no sensory dermatomal level identified. Coordination:  The patient has no difficulty with RAM's or FNF bilaterally. Motor: Strength is 5/5 in the bilateral upper and lower extremities. There is no pronator drift.  There are no fasciculations noted. DTR's: Deep tendon reflexes are 2/4 at the bilateral biceps, triceps, brachioradialis, patella and achilles.  Plantar responses are downgoing bilaterally. Gait and Station: The patient is able to ambulate without difficulty. The patient is able to heel toe walk without any difficulty. The patient is able to ambulate in a tandem fashion. The patient is able to stand in the Romberg position.    Assessment:    Analgesic rebound headache .  I think that the patient has a component of rebound/medication overuse headache.  We discussed the rebound phenomenon.  I believe that anxiety is a contributing factor.    Plan:    1. Additional workup: MRI to r/o lesion since fairly new in onset. 2. Headache diary?: yes - given to patient with pt education.  Discussed common triggers. 3.  They will make an ophthalmology appt before next visit to allow [place] a good look at the fundus. 4.  I asked the patient to decrease the abortive medications currently being used to no more than 2 days per week.  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  We discussed extensively  the r/b/se of steroid medication and I gave them the opportunity to ask questions and I answered them to the best of my ability. 5. Prophylactic medications: we discussed this but decided to wait until next visit.  Topamax and/or Cymbalta may be good choices in this patient. 7. Follow-up visit in 4 weeks, or sooner as needed should symptoms worsen or fail to respond to treatment plan as outlined.

## 2012-07-02 ENCOUNTER — Other Ambulatory Visit (HOSPITAL_COMMUNITY): Payer: 59

## 2012-07-11 ENCOUNTER — Telehealth: Payer: Self-pay | Admitting: Family Medicine

## 2012-07-11 NOTE — Telephone Encounter (Signed)
Patient needs results from tb skin test printed, she needs to pick it up this afternoon. Please call her when it is ready.

## 2012-07-11 NOTE — Telephone Encounter (Signed)
Pt informed that she can come by to get this

## 2012-07-25 ENCOUNTER — Ambulatory Visit: Payer: 59 | Admitting: Neurology

## 2012-08-15 ENCOUNTER — Encounter: Payer: Self-pay | Admitting: Family Medicine

## 2012-08-15 ENCOUNTER — Ambulatory Visit (INDEPENDENT_AMBULATORY_CARE_PROVIDER_SITE_OTHER): Payer: 59 | Admitting: Family Medicine

## 2012-08-15 VITALS — BP 135/84 | HR 94 | Temp 98.8°F | Ht 68.0 in | Wt 206.4 lb

## 2012-08-15 DIAGNOSIS — M25569 Pain in unspecified knee: Secondary | ICD-10-CM

## 2012-08-15 DIAGNOSIS — Z23 Encounter for immunization: Secondary | ICD-10-CM

## 2012-08-15 DIAGNOSIS — M25561 Pain in right knee: Secondary | ICD-10-CM

## 2012-08-15 MED ORDER — KRILL OIL PO CAPS: 1.0000 | ORAL_CAPSULE | Freq: Every day | ORAL | Status: DC

## 2012-08-15 MED ORDER — MELOXICAM 15 MG PO TABS: 15.0000 mg | ORAL_TABLET | Freq: Every day | ORAL | Status: DC | PRN

## 2012-08-15 NOTE — Progress Notes (Signed)
Patient ID: Amber Mitchell, female   DOB: 04/10/1991, 21 y.o.   MRN: [id number] Amber Mitchell [id number] 1991/01/26 08/15/2012      Progress Note-Follow Up  Subjective  Chief Complaint  Chief Complaint  Patient presents with  . Knee Pain    right- hurt knee 2 yrs ago playing volleyball- pain is off and on    HPI  Patient is an occasional 21 year old Caucasian female who struggling with right knee pain. She reports she's been having pain fairly regularly for roughly 2 years slightly more. She denies any trauma ever to this knee. She denies any symptoms below the knee such as numbness tingling weakness or swelling. The pain is over the medial meniscus historically but is now spreading anteriorly. She notes walking up stairs is difficult as his any weightbearing. Lying flat is the easiest. Advil has not been helpful. No CP/palp/SOB  Past Medical History  Diagnosis Date  . Chicken pox as a child  . Preventative health care 12/26/2011  . Overweight 12/26/2011  . Urticaria 05/20/2012  . Anxiety 06/04/2012  . Headache 06/17/2012  . Vertigo 06/17/2012    Past Surgical History  Procedure Date  . No surgical history Aug 2013    Family History  Problem Relation Age of Onset  . Hypertension Maternal Grandmother   . Migraines Maternal Grandmother     History   Social History  . Marital Status: Single    Spouse Name: N/A    Number of Children: N/A  . Years of Education: N/A   Occupational History  . Not on file.   Social History Main Topics  . Smoking status: Never Smoker   . Smokeless tobacco: Never Used  . Alcohol Use: No  . Drug Use: No  . Sexually Active: No   Other Topics Concern  . Not on file   Social History Narrative  . No narrative on file    Current Outpatient Prescriptions on File Prior to Visit  Medication Sig Dispense Refill  . acetaminophen (TYLENOL) 500 MG tablet Take 1,000 mg by mouth every 6 (six) hours as needed. For pain.        No Known  Allergies  Review of Systems  Review of Systems  Constitutional: Negative for fever and malaise/fatigue.  HENT: Negative for congestion.   Eyes: Negative for discharge.  Respiratory: Negative for shortness of breath.   Cardiovascular: Negative for chest pain, palpitations and leg swelling.  Gastrointestinal: Negative for nausea, abdominal pain and diarrhea.  Genitourinary: Negative for dysuria.  Musculoskeletal: Positive for joint pain. Negative for falls.       Right knee pain  Skin: Negative for rash.  Neurological: Negative for loss of consciousness and headaches.  Endo/Heme/Allergies: Negative for polydipsia.  Psychiatric/Behavioral: Negative for depression and suicidal ideas. The patient is not nervous/anxious and does not have insomnia.     Objective  BP 135/84  Pulse 94  Temp 98.8 F (37.1 C) (Temporal)  Ht 5\' 8"  (1.727 m)  Wt 206 lb 6.4 oz (93.622 kg)  BMI 31.38 kg/m2  SpO2 99%  LMP 07/23/2012  Physical Exam  Physical Exam  Constitutional: She is oriented to person, place, and time and well-developed, well-nourished, and in no distress. No distress.  HENT:  Head: Normocephalic and atraumatic.  Eyes: Conjunctivae normal are normal.  Neck: Neck supple. No thyromegaly present.  Cardiovascular: Normal rate, regular rhythm and normal heart sounds.   No murmur heard. Pulmonary/Chest: Effort normal and breath sounds normal. She has no wheezes.  Abdominal: She exhibits no distension and no mass.  Musculoskeletal: She exhibits tenderness. She exhibits no edema.       Pain with palp right medial meniscus. NROM, no ligamentous laxity. No tenderness over rest of joint no swelling noted.  Lymphadenopathy:    She has no cervical adenopathy.  Neurological: She is alert and oriented to person, place, and time.  Skin: Skin is warm and dry. No rash noted. She is not diaphoretic.  Psychiatric: Memory, affect and judgment normal.    Lab Results  Component Value Date   TSH  1.34 06/03/2012   Lab Results  Component Value Date   WBC 9.4 06/03/2012   HGB 12.2 06/03/2012   HCT 37.0 06/03/2012   MCV 82.8 06/03/2012   PLT 323.0 06/03/2012   Lab Results  Component Value Date   CREATININE 0.7 06/03/2012   BUN 12 06/03/2012   NA 138 06/03/2012   K 4.0 06/03/2012   CL 102 06/03/2012   CO2 27 06/03/2012   Lab Results  Component Value Date   ALT 16 06/03/2012   AST 21 06/03/2012   ALKPHOS 63 06/03/2012   BILITOT 0.3 06/03/2012   Lab Results  Component Value Date   CHOL 156 06/03/2012   Lab Results  Component Value Date   HDL 46.20 06/03/2012   Lab Results  Component Value Date   LDLCALC 90 06/03/2012   Lab Results  Component Value Date   TRIG 98.0 06/03/2012   Lab Results  Component Value Date   CHOLHDL 3 06/03/2012     Assessment & Plan  Knee pain, right She has stroke with right knee pain for several years now but over the last month it is but notably worse. Hysterectomy it's been along the medial aspect but is now spreading anteriorly. She has trouble finding a comfortable position. Lying down is the best but walking and taking stairs are more painful. No buckling or symptoms below the knee. No acute injury or trauma. No heat or warmth but maybe some slight swelling. He's become uncomfortable enough that she notes that Advil doesn't help. We will switch her to meloxicam 15 mg daily aspirin to start a Krill oil capsule daily we've ordered x-ray she is unsure she's going to be able to get it done. We will also refer to orbital for further intervention at this time.

## 2012-08-15 NOTE — Assessment & Plan Note (Signed)
She has stroke with right knee pain for several years now but over the last month it is but notably worse. Hysterectomy it's been along the medial aspect but is now spreading anteriorly. She has trouble finding a comfortable position. Lying down is the best but walking and taking stairs are more painful. No buckling or symptoms below the knee. No acute injury or trauma. No heat or warmth but maybe some slight swelling. He's become uncomfortable enough that she notes that Advil doesn't help. We will switch her to meloxicam 15 mg daily aspirin to start a Krill oil capsule daily we've ordered x-ray she is unsure she's going to be able to get it done. We will also refer to orbital for further intervention at this time.

## 2012-08-15 NOTE — Patient Instructions (Addendum)
Knee Pain The knee is the complex joint between your thigh and your lower leg. It is made up of bones, tendons, ligaments, and cartilage. The bones that make up the knee are:  The femur in the thigh.  The tibia and fibula in the lower leg.  The patella or kneecap riding in the groove on the lower femur. CAUSES  Knee pain is a common complaint with many causes. A few of these causes are:  Injury, such as:  A ruptured ligament or tendon injury.  Torn cartilage.  Medical conditions, such as:  Gout  Arthritis  Infections  Overuse, over training or overdoing a physical activity. Knee pain can be minor or severe. Knee pain can accompany debilitating injury. Minor knee problems often respond well to self-care measures or get well on their own. More serious injuries may need medical intervention or even surgery. SYMPTOMS The knee is complex. Symptoms of knee problems can vary widely. Some of the problems are:  Pain with movement and weight bearing.  Swelling and tenderness.  Buckling of the knee.  Inability to straighten or extend your knee.  Your knee locks and you cannot straighten it.  Warmth and redness with pain and fever.  Deformity or dislocation of the kneecap. DIAGNOSIS  Determining what is wrong may be very straight forward such as when there is an injury. It can also be challenging because of the complexity of the knee. Tests to make a diagnosis may include:  Your caregiver taking a history and doing a physical exam.  Routine X-rays can be used to rule out other problems. X-rays will not reveal a cartilage tear. Some injuries of the knee can be diagnosed by:  Arthroscopy a surgical technique by which a small video camera is inserted through tiny incisions on the sides of the knee. This procedure is used to examine and repair internal knee joint problems. Tiny instruments can be used during arthroscopy to repair the torn knee cartilage (meniscus).  Arthrography  is a radiology technique. A contrast liquid is directly injected into the knee joint. Internal structures of the knee joint then become visible on X-ray film.  An MRI scan is a non x-ray radiology procedure in which magnetic fields and a computer produce two- or three-dimensional images of the inside of the knee. Cartilage tears are often visible using an MRI scanner. MRI scans have largely replaced arthrography in diagnosing cartilage tears of the knee.  Blood work.  Examination of the fluid that helps to lubricate the knee joint (synovial fluid). This is done by taking a sample out using a needle and a syringe. TREATMENT The treatment of knee problems depends on the cause. Some of these treatments are:  Depending on the injury, proper casting, splinting, surgery or physical therapy care will be needed.  Give yourself adequate recovery time. Do not overuse your joints. If you begin to get sore during workout routines, back off. Slow down or do fewer repetitions.  For repetitive activities such as cycling or running, maintain your strength and nutrition.  Alternate muscle groups. For example if you are a weight lifter, work the upper body on one day and the lower body the next.  Either tight or weak muscles do not give the proper support for your knee. Tight or weak muscles do not absorb the stress placed on the knee joint. Keep the muscles surrounding the knee strong.  Take care of mechanical problems.  If you have flat feet, orthotics or special shoes may help.   See your caregiver if you need help.  Arch supports, sometimes with wedges on the inner or outer aspect of the heel, can help. These can shift pressure away from the side of the knee most bothered by osteoarthritis.  A brace called an "unloader" brace also may be used to help ease the pressure on the most arthritic side of the knee.  If your caregiver has prescribed crutches, braces, wraps or ice, use as directed. The acronym for  this is PRICE. This means protection, rest, ice, compression and elevation.  Nonsteroidal anti-inflammatory drugs (NSAID's), can help relieve pain. But if taken immediately after an injury, they may actually increase swelling. Take NSAID's with food in your stomach. Stop them if you develop stomach problems. Do not take these if you have a history of ulcers, stomach pain or bleeding from the bowel. Do not take without your caregiver's approval if you have problems with fluid retention, heart failure, or kidney problems.  For ongoing knee problems, physical therapy may be helpful.  Glucosamine and chondroitin are over-the-counter dietary supplements. Both may help relieve the pain of osteoarthritis in the knee. These medicines are different from the usual anti-inflammatory drugs. Glucosamine may decrease the rate of cartilage destruction.  Injections of a corticosteroid drug into your knee joint may help reduce the symptoms of an arthritis flare-up. They may provide pain relief that lasts a few months. You may have to wait a few months between injections. The injections do have a small increased risk of infection, water retention and elevated blood sugar levels.  Hyaluronic acid injected into damaged joints may ease pain and provide lubrication. These injections may work by reducing inflammation. A series of shots may give relief for as long as 6 months.  Topical painkillers. Applying certain ointments to your skin may help relieve the pain and stiffness of osteoarthritis. Ask your pharmacist for suggestions. Many over the-counter products are approved for temporary relief of arthritis pain.  In some countries, doctors often prescribe topical NSAID's for relief of chronic conditions such as arthritis and tendinitis. A review of treatment with NSAID creams found that they worked as well as oral medications but without the serious side effects. PREVENTION  Maintain a healthy weight. Extra pounds put  more strain on your joints.  Get strong, stay limber. Weak muscles are a common cause of knee injuries. Stretching is important. Include flexibility exercises in your workouts.  Be smart about exercise. If you have osteoarthritis, chronic knee pain or recurring injuries, you may need to change the way you exercise. This does not mean you have to stop being active. If your knees ache after jogging or playing basketball, consider switching to swimming, water aerobics or other low-impact activities, at least for a few days a week. Sometimes limiting high-impact activities will provide relief.  Make sure your shoes fit well. Choose footwear that is right for your sport.  Protect your knees. Use the proper gear for knee-sensitive activities. Use kneepads when playing volleyball or laying carpet. Buckle your seat belt every time you drive. Most shattered kneecaps occur in car accidents.  Rest when you are tired. SEEK MEDICAL CARE IF:  You have knee pain that is continual and does not seem to be getting better.  SEEK IMMEDIATE MEDICAL CARE IF:  Your knee joint feels hot to the touch and you have a high fever. MAKE SURE YOU:   Understand these instructions.  Will watch your condition.  Will get help right away if you are not   doing well or get worse. Document Released: 08/20/2007 Document Revised: 01/15/2012 Document Reviewed: 08/20/2007 [facility] Patient Information 2013 [place], [place].  Ice 2-3 x daily

## 2012-09-03 ENCOUNTER — Other Ambulatory Visit (HOSPITAL_COMMUNITY): Payer: Self-pay | Admitting: Orthopedic Surgery

## 2012-09-03 DIAGNOSIS — M25561 Pain in right knee: Secondary | ICD-10-CM

## 2012-09-06 ENCOUNTER — Ambulatory Visit (HOSPITAL_COMMUNITY)
Admission: RE | Admit: 2012-09-06 | Discharge: 2012-09-06 | Payer: 59 | Source: Ambulatory Visit | Attending: Orthopedic Surgery | Admitting: Orthopedic Surgery

## 2012-09-13 ENCOUNTER — Ambulatory Visit (HOSPITAL_COMMUNITY): Admission: RE | Admit: 2012-09-13 | Payer: 59 | Source: Ambulatory Visit

## 2013-04-04 ENCOUNTER — Ambulatory Visit (INDEPENDENT_AMBULATORY_CARE_PROVIDER_SITE_OTHER): Payer: 59 | Admitting: Nurse Practitioner

## 2013-04-04 ENCOUNTER — Encounter: Payer: Self-pay | Admitting: Nurse Practitioner

## 2013-04-04 ENCOUNTER — Other Ambulatory Visit: Payer: Self-pay | Admitting: Nurse Practitioner

## 2013-04-04 VITALS — BP 100/64 | HR 94 | Temp 97.7°F | Ht 68.0 in | Wt 234.5 lb

## 2013-04-04 DIAGNOSIS — Z0000 Encounter for general adult medical examination without abnormal findings: Secondary | ICD-10-CM

## 2013-04-04 DIAGNOSIS — Z23 Encounter for immunization: Secondary | ICD-10-CM

## 2013-04-04 DIAGNOSIS — Z020 Encounter for examination for admission to educational institution: Secondary | ICD-10-CM

## 2013-04-04 DIAGNOSIS — Z111 Encounter for screening for respiratory tuberculosis: Secondary | ICD-10-CM

## 2013-04-04 DIAGNOSIS — R635 Abnormal weight gain: Secondary | ICD-10-CM

## 2013-04-04 DIAGNOSIS — Z0289 Encounter for other administrative examinations: Secondary | ICD-10-CM

## 2013-04-04 DIAGNOSIS — H01139 Eczematous dermatitis of unspecified eye, unspecified eyelid: Secondary | ICD-10-CM

## 2013-04-04 MED ORDER — TETANUS-DIPHTH-ACELL PERTUSSIS 5-2.5-18.5 LF-MCG/0.5 IM SUSP
0.5000 mL | Freq: Once | INTRAMUSCULAR | Status: AC
  Administered 2013-04-04: 0.5 mL via INTRAMUSCULAR

## 2013-04-04 MED ORDER — FLUOCINOLONE ACETONIDE 0.01 % EX CREA: TOPICAL_CREAM | Freq: Two times a day (BID) | CUTANEOUS | Status: DC

## 2013-04-04 NOTE — Progress Notes (Signed)
coll phys Subjective:     Amber Mitchell is an 22 y.o. female who presents for college physical exam. She is concerned about scaly rash on eyelids. Questionnaire and forms reviewed with patient and responses verified. Needs tdap and PPD, varicella & rubella titers. All other immunizations are up to date. Patient has received 2 doses of MMR vaccine.   The following portions of the patient's history were reviewed and updated as appropriate: allergies, current medications, past family history, past medical history, past social history, past surgical history and problem list.  Review of Systems A comprehensive review of systems was negative except for: Constitutional: positive for 30 lb. unexplained weight gain in 14 mos. Genitourinary: positive for MC are irregular Integument/breast: positive for scaly recurring rash on eyelids, arms, toes, legs     Objective:    BP 100/64  Pulse 94  Temp(Src) 97.7 F (36.5 C) (Oral)  Ht 5\' 8"  (1.727 m)  Wt 234 lb 8 oz (106.369 kg)  BMI 35.66 kg/m2  SpO2 96%  LMP 02/02/2013 BP 100/64  Pulse 94  Temp(Src) 97.7 F (36.5 C) (Oral)  Ht 5\' 8"  (1.727 m)  Wt 234 lb 8 oz (106.369 kg)  BMI 35.66 kg/m2  SpO2 96%  LMP 02/02/2013 General appearance: alert, cooperative, appears stated age and no distress Head: Normocephalic, without obvious abnormality, atraumatic Eyes: conjunctivae/corneas clear. PERRL, EOM's intact. Fundi benign. Ears: normal TM's and external ear canals both ears Nose: Nares normal. Septum midline. Mucosa normal. No drainage or sinus tenderness. Throat: lips, mucosa, and tongue normal; teeth and gums normal Neck: no adenopathy, no carotid bruit, supple, symmetrical, trachea midline and thyroid not enlarged, symmetric, no tenderness/mass/nodules Back: symmetric, no curvature. ROM normal. No CVA tenderness. Lungs: clear to auscultation bilaterally Heart: regular rate and rhythm, S1, S2 normal, no murmur, click, rub or gallop Abdomen:  soft, non-tender; bowel sounds normal; no masses,  no organomegaly Extremities: extremities normal, atraumatic, no cyanosis or edema Pulses: 2+ and symmetric Skin: Scaly pink rash on bilateral eyelids Lymph nodes: Cervical, supraclavicular, and axillary nodes normal. Neurologic: Alert and oriented X 3, normal strength and tone. Normal symmetric reflexes. Normal coordination and gait    Assessment:   1. College physical 2.rash eyelids DD: eczema, psoriasis, heliotrope eyelids/dermatomyositis  3. Unexplained Weight gain 4.irregular menstrual cycles Plan:    1. Pt is to return to have PPD placed next week, then again 7 days later for 2-step ppd. Titers pending for varicella & rubella. Tdap today. 2. Low potency steroid cream. Advised to use sparingly as can blanch skin & cause [place] atrophy. 3. Advised to discuss w/DR. Abner Greenspan when re-checks rash 4.Discuss w/Dr. Abner Greenspan at follow-up

## 2013-04-04 NOTE — Patient Instructions (Addendum)
You will need to come back in 7 days after the first PPD test, to have the 2nd placed. Your forms will be signed once your titers are back. Please follow up on rash in 3-4 weeks-Dr Abner Greenspan or dermatology. Good luck with nursing school!

## 2013-04-07 ENCOUNTER — Telehealth: Payer: Self-pay | Admitting: Nurse Practitioner

## 2013-04-07 ENCOUNTER — Ambulatory Visit (INDEPENDENT_AMBULATORY_CARE_PROVIDER_SITE_OTHER): Payer: 59 | Admitting: *Deleted

## 2013-04-07 DIAGNOSIS — Z23 Encounter for immunization: Secondary | ICD-10-CM

## 2013-04-07 DIAGNOSIS — Z111 Encounter for screening for respiratory tuberculosis: Secondary | ICD-10-CM

## 2013-04-07 LAB — RUBELLA SCREEN: Rubella: 0.6 Index (ref <0.90)

## 2013-04-07 NOTE — Telephone Encounter (Signed)
Rubella titer too low. Needs MMR.

## 2013-04-07 NOTE — Progress Notes (Signed)
  Subjective:    Patient ID: Amber Mitchell, female    DOB: 07/08/1991, 22 y.o.   MRN: [id number]  HPI    Review of Systems     Objective:   Physical Exam        Assessment & Plan:  PPD Placement note Amber Mitchell, 22 y.o. female is here today for placement of PPD test PPD placed on 04/07/2013, Right Forearm. Patient advised to return for reading within 48-72 hours. Patient presented for PPD placement and tolerated; informed of CDC regulations of 48-72 hour window for reading and [7] day waiting period if missed/SLS

## 2013-04-08 NOTE — Telephone Encounter (Signed)
Left message on patient's cell vm to return call.

## 2013-04-09 ENCOUNTER — Ambulatory Visit: Payer: 59 | Admitting: *Deleted

## 2013-04-09 ENCOUNTER — Encounter: Payer: Self-pay | Admitting: *Deleted

## 2013-04-09 DIAGNOSIS — Z23 Encounter for immunization: Secondary | ICD-10-CM

## 2013-04-09 MED ORDER — MEASLES, MUMPS & RUBELLA VAC ~~LOC~~ INJ
0.5000 mL | INJECTION | Freq: Once | SUBCUTANEOUS | Status: AC
  Administered 2013-04-09: 0.5 mL via SUBCUTANEOUS

## 2013-04-09 NOTE — Progress Notes (Signed)
Asked kristi to add tsh to 5/30 labs.

## 2013-04-09 NOTE — Addendum Note (Signed)
Addended by: Baldemar Lenis R on: 04/09/2013 10:11 AM   Modules accepted: Orders

## 2013-04-09 NOTE — Telephone Encounter (Signed)
Patient was in office today, patient was given MMR.

## 2013-04-11 ENCOUNTER — Ambulatory Visit: Payer: 59

## 2013-04-14 ENCOUNTER — Ambulatory Visit (INDEPENDENT_AMBULATORY_CARE_PROVIDER_SITE_OTHER): Payer: 59 | Admitting: *Deleted

## 2013-04-14 DIAGNOSIS — Z23 Encounter for immunization: Secondary | ICD-10-CM

## 2013-04-16 ENCOUNTER — Encounter: Payer: Self-pay | Admitting: *Deleted

## 2013-04-16 LAB — TB SKIN TEST
Induration: 0 mm
TB Skin Test: NEGATIVE

## 2013-04-25 ENCOUNTER — Ambulatory Visit: Payer: 59 | Admitting: Nurse Practitioner

## 2013-06-22 ENCOUNTER — Emergency Department
Admission: EM | Admit: 2013-06-22 | Discharge: 2013-06-22 | Disposition: A | Discharge status: Home / Self Care | Payer: 59 | Source: Home / Self Care | Attending: Family Medicine | Admitting: Family Medicine

## 2013-06-22 DIAGNOSIS — J069 Acute upper respiratory infection, unspecified: Secondary | ICD-10-CM

## 2013-06-22 MED ORDER — DEXTROMETHORPHAN HBR 15 MG/5ML PO SYRP: 10.0000 mL | ORAL_SOLUTION | Freq: Four times a day (QID) | ORAL | Status: DC | PRN

## 2013-06-22 MED ORDER — AZITHROMYCIN 250 MG PO TABS: ORAL_TABLET | ORAL | Status: DC

## 2013-06-22 NOTE — ED Provider Notes (Signed)
CSN: [id number]     Arrival date & time 06/22/13  1310 History     First MD Initiated Contact with Patient 06/22/13 1312     Chief Complaint  Patient presents with  . Cough    x 3 weeks  . Sore Throat    x 2 days    HPI  URI Symptoms Onset: 3 weeks Description: nsal congestion, cough, sinus pressure, sore throat  Modifying factors:  None   Symptoms Nasal discharge: yes Fever: no Sore throat: yes Cough: yes Wheezing: no Ear pain: mild GI symptoms: no Sick contacts: yes  Red Flags  Stiff neck: no Dyspnea: no Rash: no Swallowing difficulty: no  Sinusitis Risk Factors Headache/face pain: no Double sickening: no tooth pain: no  Allergy Risk Factors Sneezing: no Itchy scratchy throat: no Seasonal symptoms: no  Flu Risk Factors Headache: no muscle aches: no severe fatigue: no   Past Medical History  Diagnosis Date  . Chicken pox as a child  . Preventative health care 12/26/2011  . Overweight(278.02) 12/26/2011  . Urticaria 05/20/2012  . Anxiety 06/04/2012  . Headache(784.0) 06/17/2012  . Vertigo 06/17/2012  . Rash     back, leg toes, eyelids   Past Surgical History  Procedure Laterality Date  . No surgical history  Aug 2013   Family History  Problem Relation Age of Onset  . Hypertension Maternal Grandmother   . Migraines Maternal Grandmother    History  Substance Use Topics  . Smoking status: Never Smoker   . Smokeless tobacco: Never Used  . Alcohol Use: No   OB History   Grav Para Term Preterm Abortions TAB SAB Ect Mult Living                 Review of Systems  All other systems reviewed and are negative.    Allergies  Review of patient's allergies indicates no known allergies.  Home Medications   Current Outpatient Rx  Name  Route  Sig  Dispense  Refill  . acetaminophen (TYLENOL) 500 MG tablet   Oral   Take 1,000 mg by mouth every 6 (six) hours as needed. For pain.         Marland Kitchen azithromycin (ZITHROMAX) 250 MG tablet      Take 2  tabs PO x 1 dose, then 1 tab PO QD x 4 days   6 tablet   0   . dextromethorphan 15 MG/5ML syrup   Oral   Take 10 mL (30 mg total) by mouth 4 (four) times daily as needed for cough.   120 mL   0   . Fluocinonide 0.1 % CREA   Apply externally   Apply 0.1 % topically 2 (two) times daily.         [facility] CAPS   Oral   Take 1 capsule by mouth daily. MegaRed by Schiff         . meloxicam (MOBIC) 15 MG tablet   Oral   Take 1 tablet (15 mg total) by mouth daily as needed for pain (with food).   30 tablet   2    BP 120/85  Pulse 104  Temp(Src) 98.4 F (36.9 C) (Oral)  Ht 5\' 8"  (1.727 m)  Wt 223 lb (101.152 kg)  BMI 33.91 kg/m2  SpO2 96%  LMP 05/28/2013 Physical Exam  Constitutional: She appears well-developed and well-nourished.  HENT:  Head: Normocephalic and atraumatic.  Right Ear: External ear normal.  Left Ear: External ear normal.  +  nasal erythema, rhinorrhea bilaterally, + post oropharyngeal erythema    Eyes: Conjunctivae are normal. Pupils are equal, round, and reactive to light.  Neck: Normal range of motion. Neck supple.  Cardiovascular: Normal rate, regular rhythm and normal heart sounds.   Pulmonary/Chest: Effort normal and breath sounds normal.  Abdominal: Soft.  Musculoskeletal: Normal range of motion.  Lymphadenopathy:    She has no cervical adenopathy.  Neurological: She is alert.  Skin: Skin is warm.    ED Course   Procedures (including critical care time)  Labs Reviewed - No data to display No results found. 1. URI (upper respiratory infection)     MDM  Will place on zpak for atypical coverage given duration of sxs.  Discussed infectious and ENT red flags.  Follow up as needed.     The patient and/or caregiver has been counseled thoroughly with regard to treatment plan and/or medications prescribed including dosage, schedule, interactions, rationale for use, and possible side effects and they verbalize understanding. Diagnoses and  expected course of recovery discussed and will return if not improved as expected or if the condition worsens. Patient and/or caregiver verbalized understanding.       Doree Albee, MD 06/22/13 [id number]

## 2013-06-22 NOTE — ED Notes (Signed)
Amber Mitchell complains of productive cough with green sputum for 3 weeks. She also has had a sore throat for 2 days. Denies fever, chills or sweats.

## 2013-06-25 ENCOUNTER — Telehealth: Payer: Self-pay | Admitting: Emergency Medicine

## 2013-06-25 MED ORDER — PROMETHAZINE-CODEINE 6.25-10 MG/5ML PO SYRP: ORAL_SOLUTION | ORAL | Status: DC

## 2013-10-31 ENCOUNTER — Emergency Department (HOSPITAL_BASED_OUTPATIENT_CLINIC_OR_DEPARTMENT_OTHER)
Admission: EM | Admit: 2013-10-31 | Discharge: 2013-10-31 | Disposition: A | Discharge status: Home / Self Care | Payer: 59 | Attending: Emergency Medicine | Admitting: Emergency Medicine

## 2013-10-31 ENCOUNTER — Encounter (HOSPITAL_BASED_OUTPATIENT_CLINIC_OR_DEPARTMENT_OTHER): Payer: Self-pay | Admitting: Emergency Medicine

## 2013-10-31 ENCOUNTER — Emergency Department (HOSPITAL_BASED_OUTPATIENT_CLINIC_OR_DEPARTMENT_OTHER): Payer: 59

## 2013-10-31 DIAGNOSIS — R52 Pain, unspecified: Secondary | ICD-10-CM | POA: Insufficient documentation

## 2013-10-31 DIAGNOSIS — IMO0002 Reserved for concepts with insufficient information to code with codable children: Secondary | ICD-10-CM | POA: Insufficient documentation

## 2013-10-31 DIAGNOSIS — Z872 Personal history of diseases of the skin and subcutaneous tissue: Secondary | ICD-10-CM | POA: Insufficient documentation

## 2013-10-31 DIAGNOSIS — R112 Nausea with vomiting, unspecified: Secondary | ICD-10-CM | POA: Insufficient documentation

## 2013-10-31 DIAGNOSIS — Z3202 Encounter for pregnancy test, result negative: Secondary | ICD-10-CM | POA: Insufficient documentation

## 2013-10-31 DIAGNOSIS — Z8619 Personal history of other infectious and parasitic diseases: Secondary | ICD-10-CM | POA: Insufficient documentation

## 2013-10-31 DIAGNOSIS — E663 Overweight: Secondary | ICD-10-CM | POA: Insufficient documentation

## 2013-10-31 DIAGNOSIS — Z8659 Personal history of other mental and behavioral disorders: Secondary | ICD-10-CM | POA: Insufficient documentation

## 2013-10-31 DIAGNOSIS — R1011 Right upper quadrant pain: Secondary | ICD-10-CM | POA: Insufficient documentation

## 2013-10-31 DIAGNOSIS — Z79899 Other long term (current) drug therapy: Secondary | ICD-10-CM | POA: Insufficient documentation

## 2013-10-31 LAB — LIPASE, BLOOD: Lipase: 56 U/L (ref 11–59)

## 2013-10-31 LAB — URINALYSIS, ROUTINE W REFLEX MICROSCOPIC
Glucose, UA: NEGATIVE mg/dL
Hgb urine dipstick: NEGATIVE
Ketones, ur: 15 mg/dL — AB
Nitrite: NEGATIVE
Protein, ur: NEGATIVE mg/dL
Specific Gravity, Urine: 1.029 (ref 1.005–1.030)
pH: 6 (ref 5.0–8.0)

## 2013-10-31 LAB — COMPREHENSIVE METABOLIC PANEL
AST: 20 U/L (ref 0–37)
Albumin: 4.2 g/dL (ref 3.5–5.2)
Alkaline Phosphatase: 76 U/L (ref 39–117)
BUN: 15 mg/dL (ref 6–23)
Chloride: 103 mEq/L (ref 96–112)
GFR calc Af Amer: >90 mL/min (ref >90)
Glucose, Bld: 98 mg/dL (ref 70–99)
Potassium: 4.1 mEq/L (ref 3.5–5.1)
Total Bilirubin: 0.2 mg/dL — ABNORMAL LOW (ref 0.3–1.2)

## 2013-10-31 LAB — CBC WITH DIFFERENTIAL/PLATELET
Basophils Relative: 0 % (ref 0–1)
Hemoglobin: 13.1 g/dL (ref 12.0–15.0)
Lymphocytes Relative: 7 % — ABNORMAL LOW (ref 12–46)
Lymphs Abs: 1.1 10*3/uL (ref 0.7–4.0)
MCHC: 32.8 g/dL (ref 30.0–36.0)
Monocytes Relative: 6 % (ref 3–12)
Neutro Abs: 13.5 10*3/uL — ABNORMAL HIGH (ref 1.7–7.7)
Neutrophils Relative %: 86 % — ABNORMAL HIGH (ref 43–77)
Platelets: 294 10*3/uL (ref 150–400)
RBC: 4.82 MIL/uL (ref 3.87–5.11)
WBC: 15.6 10*3/uL — ABNORMAL HIGH (ref 4.0–10.5)

## 2013-10-31 LAB — URINE MICROSCOPIC-ADD ON

## 2013-10-31 MED ORDER — OMEPRAZOLE 20 MG PO CPDR: 20.0000 mg | DELAYED_RELEASE_CAPSULE | Freq: Every day | ORAL | Status: DC

## 2013-10-31 MED ORDER — SODIUM CHLORIDE 0.9 % IV SOLN
Freq: Once | INTRAVENOUS | Status: AC
  Administered 2013-10-31: 20:00:00 via INTRAVENOUS

## 2013-10-31 MED ORDER — ONDANSETRON 8 MG PO TBDP
8.0000 mg | ORAL_TABLET | Freq: Once | ORAL | Status: AC
  Administered 2013-10-31: 8 mg via ORAL
  Filled 2013-10-31: qty 1

## 2013-10-31 MED ORDER — HYDROCODONE-ACETAMINOPHEN 5-325 MG PO TABS: 2.0000 | ORAL_TABLET | ORAL | Status: DC | PRN

## 2013-10-31 MED ORDER — FAMOTIDINE 20 MG PO TABS
20.0000 mg | ORAL_TABLET | Freq: Once | ORAL | Status: AC
  Administered 2013-10-31: 20 mg via ORAL
  Filled 2013-10-31: qty 1

## 2013-10-31 MED ORDER — ONDANSETRON 8 MG PO TBDP: ORAL_TABLET | ORAL | Status: DC

## 2013-10-31 MED ORDER — METOCLOPRAMIDE HCL 10 MG PO TABS: 10.0000 mg | ORAL_TABLET | Freq: Four times a day (QID) | ORAL | Status: DC | PRN

## 2013-10-31 NOTE — ED Provider Notes (Signed)
CSN: [id number]     Arrival date & time 10/31/13  1432 History   First MD Initiated Contact with Patient 10/31/13 1731     Chief Complaint  Patient presents with  . Abdominal Pain   (Consider location/radiation/quality/duration/timing/severity/associated sxs/prior Treatment) HPI Several weeks intermittent waves of right upper quadrant abdominal pain after eating occasional nausea and vomiting nonbloody, spells lasting anywhere from a few hours to several hours at a time, most recent episode was a few hours today which is now much improved he no minimal after having moderately severe pain earlier this afternoon with nonbloody vomiting a few times today; no fever no headache no cough no chest pain no shortness of breath no lower abdominal pain no vaginal bleeding no dysuria no vaginal discharge no rash no trauma no treatment prior to arrival.   Past Medical History  Diagnosis Date  . Chicken pox as a child  . Preventative health care 12/26/2011  . Overweight(278.02) 12/26/2011  . Urticaria 05/20/2012  . Anxiety 06/04/2012  . Headache(784.0) 06/17/2012  . Vertigo 06/17/2012  . Rash     back, leg toes, eyelids   Past Surgical History  Procedure Laterality Date  . No surgical history  Aug 2013   Family History  Problem Relation Age of Onset  . Hypertension Maternal Grandmother   . Migraines Maternal Grandmother    History  Substance Use Topics  . Smoking status: Never Smoker   . Smokeless tobacco: Never Used  . Alcohol Use: No   OB History   Grav Para Term Preterm Abortions TAB SAB Ect Mult Living                 Review of Systems 10 Systems reviewed and are negative for acute change except as noted in the HPI. Allergies  Review of patient's allergies indicates no known allergies.  Home Medications   Current Outpatient Rx  Name  Route  Sig  Dispense  Refill  . acetaminophen (TYLENOL) 500 MG tablet   Oral   Take 1,000 mg by mouth every 6 (six) hours as needed. For pain.        Marland Kitchen azithromycin (ZITHROMAX) 250 MG tablet      Take 2 tabs PO x 1 dose, then 1 tab PO QD x 4 days   6 tablet   0   . dextromethorphan 15 MG/5ML syrup   Oral   Take 10 mL (30 mg total) by mouth 4 (four) times daily as needed for cough.   120 mL   0   . Fluocinonide 0.1 % CREA   Apply externally   Apply 0.1 % topically 2 (two) times daily.         Marland Kitchen HYDROcodone-acetaminophen (NORCO) 5-325 MG per tablet   Oral   Take 2 tablets by mouth every 4 (four) hours as needed.   10 tablet   0   . Krill Oil CAPS   Oral   Take 1 capsule by mouth daily. MegaRed by Schiff         . meloxicam (MOBIC) 15 MG tablet   Oral   Take 1 tablet (15 mg total) by mouth daily as needed for pain (with food).   30 tablet   2   . metoCLOPramide (REGLAN) 10 MG tablet   Oral   Take 1 tablet (10 mg total) by mouth every 6 (six) hours as needed for nausea (nausea/headache).   6 tablet   0   . omeprazole (PRILOSEC) 20 MG capsule  Oral   Take 1 capsule (20 mg total) by mouth daily.   5 capsule   0   . ondansetron (ZOFRAN ODT) 8 MG disintegrating tablet      8mg  ODT q4 hours prn nausea   4 tablet   0   . promethazine-codeine (PHENERGAN WITH CODEINE) 6.25-10 MG/5ML syrup      Take 1-2 teaspoons every 6 hours as needed for cough. May cause drowsiness.   120 mL   0    BP 114/54  Pulse 87  Temp(Src) 98.6 F (37 C) (Oral)  Resp 18  Ht 5\' 8"  (1.727 m)  Wt 180 lb (81.647 kg)  BMI 27.38 kg/m2  SpO2 99%  LMP 10/17/2013 Physical Exam  Nursing note and vitals reviewed. Constitutional:  Awake, alert, nontoxic appearance.  HENT:  Head: Atraumatic.  Eyes: Right eye exhibits no discharge. Left eye exhibits no discharge.  Neck: Neck supple.  Cardiovascular: Normal rate and regular rhythm.   No murmur heard. Pulmonary/Chest: Effort normal and breath sounds normal. No respiratory distress. She has no wheezes. She has no rales. She exhibits no tenderness.  Abdominal: Soft. Bowel  sounds are normal. She exhibits no distension and no mass. There is tenderness. There is no rebound and no guarding.  Minimally tender right upper quadrant  Musculoskeletal: She exhibits no edema and no tenderness.  Baseline ROM, no obvious new focal weakness.  Neurological: She is alert.  Mental status and motor strength appears baseline for patient and situation.  Skin: No rash noted.  Psychiatric: She has a normal mood and affect.    ED Course  Procedures (including critical care time) Patient / Family / Caregiver informed of clinical course, understand medical decision-making process, and agree with plan. Labs Review Labs Reviewed  URINALYSIS, ROUTINE W REFLEX MICROSCOPIC - Abnormal; Notable for the following:    Color, Urine AMBER (*)    APPearance CLOUDY (*)    Bilirubin Urine SMALL (*)    Ketones, ur 15 (*)    Leukocytes, UA SMALL (*)    All other components within normal limits  URINE MICROSCOPIC-ADD ON - Abnormal; Notable for the following:    Squamous Epithelial / LPF MANY (*)    Bacteria, UA MANY (*)    All other components within normal limits  CBC WITH DIFFERENTIAL - Abnormal; Notable for the following:    WBC 15.6 (*)    Neutrophils Relative % 86 (*)    Neutro Abs 13.5 (*)    Lymphocytes Relative 7 (*)    All other components within normal limits  COMPREHENSIVE METABOLIC PANEL - Abnormal; Notable for the following:    Total Bilirubin 0.2 (*)    All other components within normal limits  URINE CULTURE  PREGNANCY, URINE  LIPASE, BLOOD   Imaging Review US Abdomen Complete  10/31/2013   CLINICAL DATA:  Abdominal pain.  EXAM: ULTRASOUND ABDOMEN COMPLETE  COMPARISON:  None.  FINDINGS: Gallbladder:  No gallstones or wall thickening visualized. No sonographic Murphy sign noted.  Common bile duct:  Diameter: 3 mm, normal.  Liver:  Grossly normal.  IVC:  Grossly normal.  Pancreas:  Grossly normal.  Spleen:  8.8 cm.  Normal echotexture.  Right Kidney:  Length: 11 cm.  Echogenicity within normal limits. No mass or hydronephrosis visualized.  Left Kidney:  Length: 12.3 cm. Echogenicity within normal limits. No mass or hydronephrosis visualized.  Abdominal aorta:  No aneurysm visualized.  Other findings:  Exam technically degraded by a large amount overlying bowel gas.  IMPRESSION: No acute abnormality.  Negative for cholelithiasis or cholecystitis.   Electronically Signed   By: Andreas Newport M.D.   On: 10/31/2013 18:43    EKG Interpretation   None       MDM   1. Abdominal pain, acute, right upper quadrant    I doubt any other EMC precluding discharge at this time including, but not necessarily limited to the following: Cholecystitis.    Hurman Horn, MD 11/02/13 [phone]

## 2013-10-31 NOTE — ED Notes (Signed)
Pt c/o diffuse abd pain x 1 month

## 2013-11-01 LAB — URINE CULTURE

## 2013-11-03 ENCOUNTER — Ambulatory Visit (INDEPENDENT_AMBULATORY_CARE_PROVIDER_SITE_OTHER): Payer: 59 | Admitting: Family Medicine

## 2013-11-03 ENCOUNTER — Encounter: Payer: Self-pay | Admitting: Family Medicine

## 2013-11-03 VITALS — BP 109/69 | HR 80 | Temp 98.6°F | Resp 18 | Ht 68.5 in | Wt 233.0 lb

## 2013-11-03 DIAGNOSIS — R110 Nausea: Secondary | ICD-10-CM

## 2013-11-03 DIAGNOSIS — R1011 Right upper quadrant pain: Secondary | ICD-10-CM

## 2013-11-03 MED ORDER — HYDROCODONE-ACETAMINOPHEN 5-325 MG PO TABS: 2.0000 | ORAL_TABLET | ORAL | Status: DC | PRN

## 2013-11-03 NOTE — Assessment & Plan Note (Signed)
Suspicious for cholelithiasis/cholecystitis but abd u/s normal. Plan is to get HIDA scan, continue prn norco 5/325 (she is taking this very sparingly) prn, start the prilosec qd and the zofran prn that she was rx'd by EDP. Since she has been clinically stable since ED visit 3 d/a I don't see any indication for repeat labs today. If HIDA scan normal then would proceed with CT abd/pelv with oral and IV contrast.

## 2013-11-03 NOTE — Progress Notes (Signed)
Pre visit review using our clinic review tool, if applicable. No additional management support is needed unless otherwise documented below in the visit note. 

## 2013-11-03 NOTE — Progress Notes (Signed)
OFFICE NOTE  11/03/2013  CC:  Chief Complaint  Patient presents with  . Hospitalization Follow-up  . Abdominal Pain    right sided      HPI: Patient is a 22 y.o. Caucasian female who is here for f/u recent ED visit for episodic upper abdominal pain (10/31/13).  Abd u/s was normal.  WBC 15K, increased PMNs, otherwise blood work normal (including CMET and lipase).  UA mildly abnl but urine clx neg.   UPT neg.  Reports 1 mo of recurrent nausea and vomiting--about 1 time per week, pt notes it was connected to intake of certain foods ("hotdogs and eggs").  Usually happens in night time.  Usually not assoc with pain but 3d/a it did have RUQ pain with it so she went to the ED.  The postprandial sickness usually lasts a couple of hours, but lately has lasted for days at a time.  No fevers.  She had two watery BMs the night before her presentation to the ED but none before or since.  She has taken norco rx'd by the EDP (only 3 pills) but has not taken the prilosec, zofran, or reglan rx'd by EDP b/c she has felt pain only, no n/v now.   No sick contacts. No OTC or herbal meds used.  She denies any dysuria, urinary urgency or frequency, or hematuria. Increased stress at school lately+.  Pertinent PMH:  Past Medical History  Diagnosis Date  . Overweight(278.02) 12/26/2011  . Urticaria 05/20/2012  . Anxiety 06/04/2012  . Headache(784.0) 06/17/2012  . Vertigo 06/17/2012  . Rash     back, leg toes, eyelids   Pertinent FH: her younger sister, her mother, and her maternal GM have all had GB disease requiring cholecystectomy.  MEDS: Pt only taking norco listed below Outpatient Prescriptions Prior to Visit  Medication Sig Dispense Refill  . HYDROcodone-acetaminophen (NORCO) 5-325 MG per tablet Take 2 tablets by mouth every 4 (four) hours as needed.  10 tablet  0  . metoCLOPramide (REGLAN) 10 MG tablet Take 1 tablet (10 mg total) by mouth every 6 (six) hours as needed for nausea (nausea/headache).  6  tablet  0  . omeprazole (PRILOSEC) 20 MG capsule Take 1 capsule (20 mg total) by mouth daily.  5 capsule  0  . ondansetron (ZOFRAN ODT) 8 MG disintegrating tablet 8mg  ODT q4 hours prn nausea  4 tablet  0  . acetaminophen (TYLENOL) 500 MG tablet Take 1,000 mg by mouth every 6 (six) hours as needed. For pain.      Marland Kitchen azithromycin (ZITHROMAX) 250 MG tablet Take 2 tabs PO x 1 dose, then 1 tab PO QD x 4 days  6 tablet  0  . dextromethorphan 15 MG/5ML syrup Take 10 mL (30 mg total) by mouth 4 (four) times daily as needed for cough.  120 mL  0  . Fluocinonide 0.1 % CREA Apply 0.1 % topically 2 (two) times daily.      [place] Lan[employer] CAPS Take 1 capsule by mouth daily. MegaRed by Schiff      . meloxicam (MOBIC) 15 MG tablet Take 1 tablet (15 mg total) by mouth daily as needed for pain (with food).  30 tablet  2  . promethazine-codeine (PHENERGAN WITH CODEINE) 6.25-10 MG/5ML syrup Take 1-2 teaspoons every 6 hours as needed for cough. May cause drowsiness.  120 mL  0   No facility-administered medications prior to visit.    PE: Blood pressure 109/69, pulse 80, temperature 98.6 F (37  C), temperature source Temporal, resp. rate 18, height 5' 8.5" (1.74 m), weight 233 lb (105.688 kg), last menstrual period 10/17/2013, SpO2 98.00%. Gen: Alert, well appearing, obese white female in NAD.  Patient is oriented to person, place, time, and situation. QMV:HQIO: no injection, icteris, swelling, or exudate.  EOMI, PERRLA. Mouth: lips without lesion/swelling.  Oral mucosa pink and moist. Oropharynx without erythema, exudate, or swelling.  CV: RRR, no m/r/g.   LUNGS: CTA bilat, nonlabored resps, good aeration in all lung fields. ABD: soft, ND, BS hypoactive.  RUQ TTP, no guarding or rebound.  Mild mid epigastric, umbillical, and RLQ tenderness to palpation. EXT: no clubbing, cyanosis, or edema.    IMPRESSION AND PLAN:  Postprandial abdominal pain in right upper quadrant Suspicious for cholelithiasis/cholecystitis  but abd u/s normal. Plan is to get HIDA scan, continue prn norco 5/325 (she is taking this very sparingly) prn, start the prilosec qd and the zofran prn that she was rx'd by EDP. Since she has been clinically stable since ED visit 3 d/a I don't see any indication for repeat labs today. If HIDA scan normal then would proceed with CT abd/pelv with oral and IV contrast.   An After Visit Summary was printed and given to the patient.  FOLLOW UP: 1 wk

## 2013-11-10 ENCOUNTER — Ambulatory Visit: Payer: 59 | Admitting: Family Medicine

## 2013-11-14 ENCOUNTER — Encounter: Payer: Self-pay | Admitting: Family Medicine

## 2013-11-14 ENCOUNTER — Ambulatory Visit (HOSPITAL_COMMUNITY)
Admission: RE | Admit: 2013-11-14 | Discharge: 2013-11-14 | Disposition: A | Discharge status: Home / Self Care | Payer: 59 | Source: Ambulatory Visit | Attending: Family Medicine | Admitting: Family Medicine

## 2013-11-14 ENCOUNTER — Ambulatory Visit (INDEPENDENT_AMBULATORY_CARE_PROVIDER_SITE_OTHER): Payer: 59 | Admitting: Family Medicine

## 2013-11-14 VITALS — BP 110/77 | HR 97 | Temp 98.4°F | Resp 18 | Ht 68.5 in | Wt 235.0 lb

## 2013-11-14 DIAGNOSIS — R1011 Right upper quadrant pain: Secondary | ICD-10-CM

## 2013-11-14 DIAGNOSIS — K828 Other specified diseases of gallbladder: Secondary | ICD-10-CM

## 2013-11-14 DIAGNOSIS — R110 Nausea: Secondary | ICD-10-CM

## 2013-11-14 LAB — CBC WITH DIFFERENTIAL/PLATELET
Basophils Absolute: 0 10*3/uL (ref 0.0–0.1)
Basophils Relative: 0 % (ref 0–1)
Eosinophils Absolute: 0.5 10*3/uL (ref 0.0–0.7)
Eosinophils Relative: 4 % (ref 0–5)
HCT: 39.8 % (ref 36.0–46.0)
HEMOGLOBIN: 13.3 g/dL (ref 12.0–15.0)
LYMPHS ABS: 2.2 10*3/uL (ref 0.7–4.0)
LYMPHS PCT: 20 % (ref 12–46)
MCH: 26.8 pg (ref 26.0–34.0)
MCHC: 33.4 g/dL (ref 30.0–36.0)
MCV: 80.1 fL (ref 78.0–100.0)
MONOS PCT: 9 % (ref 3–12)
Monocytes Absolute: 1 10*3/uL (ref 0.1–1.0)
Neutro Abs: 7.5 10*3/uL (ref 1.7–7.7)
Neutrophils Relative %: 67 % (ref 43–77)
PLATELETS: 353 10*3/uL (ref 150–400)
RBC: 4.97 MIL/uL (ref 3.87–5.11)
RDW: 13.8 % (ref 11.5–15.5)
WBC: 11.2 10*3/uL — AB (ref 4.0–10.5)

## 2013-11-14 LAB — COMPREHENSIVE METABOLIC PANEL
ALBUMIN: 4.4 g/dL (ref 3.5–5.2)
ALT: 23 U/L (ref 0–35)
AST: 23 U/L (ref 0–37)
Alkaline Phosphatase: 73 U/L (ref 39–117)
BUN: 10 mg/dL (ref 6–23)
CALCIUM: 8.6 mg/dL (ref 8.4–10.5)
CHLORIDE: 102 meq/L (ref 96–112)
CO2: 30 meq/L (ref 19–32)
Creat: 0.7 mg/dL (ref 0.50–1.10)
GLUCOSE: 81 mg/dL (ref 70–99)
Potassium: 4.1 mEq/L (ref 3.5–5.3)
SODIUM: 138 meq/L (ref 135–145)
TOTAL PROTEIN: 7.2 g/dL (ref 6.0–8.3)
Total Bilirubin: 0.2 mg/dL — ABNORMAL LOW (ref 0.3–1.2)

## 2013-11-14 MED ORDER — ONDANSETRON 8 MG PO TBDP: ORAL_TABLET | ORAL | Status: DC

## 2013-11-14 MED ORDER — TECHNETIUM TC 99M MEBROFENIN IV KIT
5.0000 | PACK | Freq: Once | INTRAVENOUS | Status: AC | PRN
  Administered 2013-11-14: 5 via INTRAVENOUS

## 2013-11-14 NOTE — Progress Notes (Signed)
OFFICE NOTE  11/14/2013  CC:  Chief Complaint  Patient presents with  . Abdominal Pain    follow up     HPI: Patient is a 23 y.o. Caucasian female who is here for 10d f/u RUQ pain. She had her HIDA scan today and it showed no biliary obstruction but GB EF was 26% (normal >33%). She continues to have postprandial RUQ pain and nausea with most foods.  She had tolerate soup without having to take her nausea med.  Has to take 1/2-1 norco tab on some days but otherwise is adjusting diet to avoid symptoms.  Still feels "sickish" 24/7, though.She is drinking fluids fairly well.  Says urine output is normal.  NO fevers.  No pain in any other areas of the abdomen.  She had a loose BM yesterday, otherwise normal BMs.  Pertinent PMH:  Past Medical History  Diagnosis Date  . Overweight(278.02) 12/26/2011  . Urticaria 05/20/2012  . Anxiety 06/04/2012  . Headache(784.0) 06/17/2012  . Vertigo 06/17/2012  . Rash     back, leg toes, eyelids   Past Surgical History  Procedure Laterality Date  . No surgical history  Aug 2013   FH: several female family members have had gallbladder problems and required cholecystectomy  MEDS:  Outpatient Prescriptions Prior to Visit  Medication Sig Dispense Refill  . HYDROcodone-acetaminophen (NORCO) 5-325 MG per tablet Take 2 tablets by mouth every 4 (four) hours as needed.  30 tablet  0  . ondansetron (ZOFRAN ODT) 8 MG disintegrating tablet 8mg  ODT q4 hours prn nausea  4 tablet  0  . omeprazole (PRILOSEC) 20 MG capsule Take 1 capsule (20 mg total) by mouth daily.  5 capsule  0   No facility-administered medications prior to visit.    PE: Blood pressure 110/77, pulse 97, temperature 98.4 F (36.9 C), temperature source Temporal, resp. rate 18, height 5' 8.5" (1.74 m), weight 235 lb (106.595 kg), last menstrual period 10/17/2013, SpO2 98.00%. Gen: alert, pale and tired appearing but nontoxic/NAD.  Oriented x 4. SKIN: mild pallor diffusely but no  jaundice ZOX:WRUE: no injection, icteris, swelling, or exudate.  EOMI, PERRLA. Mouth: lips without lesion/swelling.  Oral mucosa pink and moist. Oropharynx without erythema, exudate, or swelling.  CV: RRR, no m/r/g.   LUNGS: CTA bilat, nonlabored resps, good aeration in all lung fields. ABD: soft, nondistended.  BS are present but a little hypoactive in all quadrants.  No mass.  She has tenderness focally in RUQ, +murphy's sign.  No rebound tenderness or guarding.    Chemistry      Component Value Date/Time   NA 140 10/31/2013 1900   K 4.1 10/31/2013 1900   CL 103 10/31/2013 1900   CO2 25 10/31/2013 1900   BUN 15 10/31/2013 1900   CREATININE 0.70 10/31/2013 1900      Component Value Date/Time   CALCIUM 9.1 10/31/2013 1900   ALKPHOS 76 10/31/2013 1900   AST 20 10/31/2013 1900   ALT 14 10/31/2013 1900   BILITOT 0.2* 10/31/2013 1900     Lab Results  Component Value Date   WBC 15.6* 10/31/2013   HGB 13.1 10/31/2013   HCT 39.9 10/31/2013   MCV 82.8 10/31/2013   PLT 294 10/31/2013    IMPRESSION AND PLAN:  Recurrent RUQ postprandial pain and nausea w/out gallstones but with HIDA scan evidence of biliary dyskinesia. We have arranged a referral to gen surg for next Wednesday, 11/19/13. Will recheck CBC and CMET today.  If worsening WBC count  or if hepatic testing abnormal then will consider referring pt to the ED for more urgent surgical evaluation. Zofran refilled. She says she is doing fine as far as pain pills go.  Signs/symptoms to call or return for were reviewed and pt expressed understanding.  FOLLOW UP: prn

## 2013-11-14 NOTE — Patient Instructions (Signed)
You have an appt with general surgery[facility], on Wednesday 11/19/13 at 10:40 am.

## 2013-11-17 ENCOUNTER — Ambulatory Visit: Payer: 59 | Admitting: Family Medicine

## 2013-11-19 ENCOUNTER — Ambulatory Visit (INDEPENDENT_AMBULATORY_CARE_PROVIDER_SITE_OTHER): Payer: 59 | Admitting: Surgery

## 2013-11-19 ENCOUNTER — Encounter (INDEPENDENT_AMBULATORY_CARE_PROVIDER_SITE_OTHER): Payer: Self-pay | Admitting: General Surgery

## 2013-11-19 ENCOUNTER — Telehealth (INDEPENDENT_AMBULATORY_CARE_PROVIDER_SITE_OTHER): Payer: Self-pay | Admitting: General Surgery

## 2013-11-19 ENCOUNTER — Ambulatory Visit (INDEPENDENT_AMBULATORY_CARE_PROVIDER_SITE_OTHER): Payer: Commercial Managed Care - PPO | Admitting: General Surgery

## 2013-11-19 VITALS — BP 122/80 | HR 86 | Temp 97.7°F | Resp 16 | Ht 68.0 in | Wt 236.6 lb

## 2013-11-19 DIAGNOSIS — R1011 Right upper quadrant pain: Secondary | ICD-10-CM

## 2013-11-19 NOTE — Patient Instructions (Signed)
Please call with any questions.

## 2013-11-19 NOTE — Telephone Encounter (Signed)
Patient met with surgery scheduling went over financial responsibilities, will call back to schedule °

## 2013-11-21 NOTE — Progress Notes (Signed)
Patient ID: Amber LeachKelly E Farha, female   DOB: 1991/05/04, 23 y.o.   MRN: [id number]  Chief Complaint  Patient presents with  . Abdominal Pain    gallstone    HPI Amber Mitchell is a 23 y.o. female.   Abdominal Pain Associated symptoms: no chest pain, no dysuria, no fever and no shortness of breath   23 year old Caucasian female referred by Dr. Milinda CaveMcGowen for evaluation of right upper quadrant pain. The patient started having problems around Christmas. She describes pain in her right upper abdomen radiating to her side. It primarily occurs in the evening. It is associated with nausea and sometimes emesis. At one point the pain got so severe it prompted her to go to the emergency room. Labs and ultrasound were performed. She had a mild white count of around 15,000. She followed up with her primary care physician. She had Ongoing symptoms therefore a nuclear medicine scan was obtained. She had a low gallbladder ejection fraction. She has noticed a correlation with certain foods. Foods like hot dogs and eggs tend to trigger an episode. She denies any jaundice, acholic stools, NSAID use, constipation. She has had some loose stool. Today she had some blood parenchyma. She has a strong family history of gallbladder problems. Her mother, sister, and grandmother have all had their gallbladders removed. She denies any weight loss.  Past Medical History  Diagnosis Date  . Overweight 12/26/2011  . Urticaria 05/20/2012  . Anxiety 06/04/2012  . Headache(784.0) 06/17/2012  . Vertigo 06/17/2012  . Rash     back, leg toes, eyelids    Past Surgical History  Procedure Laterality Date  . No surgical history  Aug 2013    Family History  Problem Relation Age of Onset  . Hypertension Maternal Grandmother   . Migraines Maternal Grandmother     Social History History  Substance Use Topics  . Smoking status: Never Smoker   . Smokeless tobacco: Never Used  . Alcohol Use: No    No Known Allergies  Current  Outpatient Prescriptions  Medication Sig Dispense Refill  . HYDROcodone-acetaminophen (NORCO) 5-325 MG per tablet Take 2 tablets by mouth every 4 (four) hours as needed.  30 tablet  0  . omeprazole (PRILOSEC) 20 MG capsule Take 1 capsule (20 mg total) by mouth daily.  5 capsule  0  . ondansetron (ZOFRAN ODT) 8 MG disintegrating tablet 8mg  ODT q4 hours prn nausea  20 tablet  1   No current facility-administered medications for this visit.    Review of Systems Review of Systems  Constitutional: Negative for fever, activity change, appetite change and unexpected weight change.  HENT: Negative for nosebleeds and trouble swallowing.   Eyes: Negative for photophobia and visual disturbance.  Respiratory: Negative for chest tightness and shortness of breath.   Cardiovascular: Negative for chest pain and leg swelling.       Denies CP, SOB, orthopnea, PND, DOE  Gastrointestinal: Positive for abdominal pain.  Genitourinary: Negative for dysuria and difficulty urinating.  Musculoskeletal: Negative for arthralgias.  Skin: Negative for pallor and rash.  Neurological: Negative for dizziness, seizures, facial asymmetry and numbness.          Hematological: Negative for adenopathy. Does not bruise/bleed easily.  Psychiatric/Behavioral: Negative for behavioral problems and agitation.    Blood pressure 122/80, pulse 86, temperature 97.7 F (36.5 C), temperature source Temporal, resp. rate 16, height 5\' 8"  (1.727 m), weight 236 lb 9.6 oz (107.321 kg), last menstrual period 10/17/2013.  Physical Exam Physical  Exam  Vitals reviewed. Constitutional: She is oriented to person, place, and time. She appears well-developed and well-nourished. No distress.  obese  HENT:  Head: Normocephalic and atraumatic.  Right Ear: External ear normal.  Left Ear: External ear normal.  Eyes: Conjunctivae are normal. No scleral icterus.  Neck: Normal range of motion. Neck supple. No tracheal deviation present. No  thyromegaly present.  Cardiovascular: Normal rate and normal heart sounds.   Pulmonary/Chest: Effort normal and breath sounds normal. No stridor. No respiratory distress. She has no wheezes.  Abdominal: Soft. She exhibits no distension. There is tenderness (mild) in the right upper quadrant. There is no rebound and no guarding.  Musculoskeletal: She exhibits no edema and no tenderness.  Lymphadenopathy:    She has no cervical adenopathy.  Neurological: She is alert and oriented to person, place, and time. She exhibits normal muscle tone.  Skin: Skin is warm and dry. No rash noted. She is not diaphoretic. No erythema.  Psychiatric: She has a normal mood and affect. Her behavior is normal. Judgment and thought content normal.    Data Reviewed McGowen note 1/9 & 12/29 Bednar note U/s abd 12/26 - wnl cmet 1/9 and 12/26 wnl Cbc 1/9 and 12/26 - wbc normalized on f/u HIDA - ducts patent, EF 26%  Assessment    RUQ pain Biliary dyskinesia     Plan    I believe the patient's symptoms are consistent with gallbladder disease.  We discussed gallbladder disease. The patient was given Agricultural engineer. We discussed non-operative and operative management. We discussed the signs & symptoms of acute cholecystitis  I discussed laparoscopic cholecystectomy with IOC in detail.  The patient was given educational material as well as diagrams detailing the procedure.  We discussed the risks and benefits of a laparoscopic cholecystectomy including, but not limited to bleeding, infection, injury to surrounding structures such as the intestine or liver, bile leak, retained gallstones, need to convert to an open procedure, failure to ameliorate her pain, prolonged diarrhea, blood clots such as  DVT, common bile duct injury, anesthesia risks, and possible need for additional procedures.  We discussed the typical post-operative recovery course. I explained that the likelihood of improvement of their symptoms  is good.   She has elected to proceed with surgery.  Mary Sella. Andrey Campanile, MD, FACS General, Bariatric, & Minimally Invasive Surgery [facility], [place]        [facility] M 11/21/2013, 2:04 PM

## 2013-11-24 ENCOUNTER — Encounter (HOSPITAL_COMMUNITY)
Admission: RE | Admit: 2013-11-24 | Discharge: 2013-11-24 | Disposition: A | Discharge status: Home / Self Care | Payer: 59 | Source: Ambulatory Visit | Attending: General Surgery | Admitting: General Surgery

## 2013-11-24 ENCOUNTER — Encounter (HOSPITAL_COMMUNITY): Payer: Self-pay | Admitting: Pharmacy Technician

## 2013-11-24 ENCOUNTER — Encounter (HOSPITAL_COMMUNITY): Payer: Self-pay

## 2013-11-24 LAB — CBC WITH DIFFERENTIAL/PLATELET
BASOS ABS: 0 10*3/uL (ref 0.0–0.1)
Basophils Relative: 0 % (ref 0–1)
Eosinophils Absolute: 0.2 10*3/uL (ref 0.0–0.7)
Eosinophils Relative: 4 % (ref 0–5)
HCT: 38.6 % (ref 36.0–46.0)
Hemoglobin: 12.3 g/dL (ref 12.0–15.0)
Lymphocytes Relative: 31 % (ref 12–46)
Lymphs Abs: 2.1 10*3/uL (ref 0.7–4.0)
MCH: 26.5 pg (ref 26.0–34.0)
MCHC: 31.9 g/dL (ref 30.0–36.0)
MCV: 83 fL (ref 78.0–100.0)
Monocytes Absolute: 0.7 10*3/uL (ref 0.1–1.0)
Monocytes Relative: 10 % (ref 3–12)
NEUTROS ABS: 3.8 10*3/uL (ref 1.7–7.7)
Neutrophils Relative %: 56 % (ref 43–77)
PLATELETS: 314 10*3/uL (ref 150–400)
RBC: 4.65 MIL/uL (ref 3.87–5.11)
RDW: 12.9 % (ref 11.5–15.5)
WBC: 6.8 10*3/uL (ref 4.0–10.5)

## 2013-11-24 LAB — HCG, SERUM, QUALITATIVE: PREG SERUM: NEGATIVE

## 2013-11-24 NOTE — Patient Instructions (Signed)
[name]  11/24/2013   Your procedure is scheduled on: 11/25/13  [facility] at 10:00 AM.  Call this number if you have problems the morning of surgery [phone]: [phone]   Remember:   Do not eat food or drink liquids After Midnight.     Take these medicines the morning of surgery with A SIP OF WATER: hydrocodone if needed, zofran if needed   Do not wear jewelry, make-up or nail polish.  Do not wear lotions, powders, or perfumes. You may wear deodorant.  Do not shave 48 hours prior to surgery. Men may shave face and neck.  Do not bring valuables to the hospital.   Patients discharged the day of surgery will not be allowed to drive home.  Name and phone number of your driver: Arline AspCindy (mom) [phone]  Birdie Sonsachel Bergen Magner, RN  pre op nurse call if needed [phone]    FAILURE TO FOLLOW THESE INSTRUCTIONS MAY RESULT IN CANCELLATION OF YOUR SURGERY   Patient Signature: ___________________________________________

## 2013-11-25 ENCOUNTER — Encounter (HOSPITAL_COMMUNITY): Payer: Self-pay | Admitting: Certified Registered Nurse Anesthetist

## 2013-11-25 ENCOUNTER — Encounter (HOSPITAL_COMMUNITY): Payer: 59 | Admitting: Anesthesiology

## 2013-11-25 ENCOUNTER — Ambulatory Visit (HOSPITAL_COMMUNITY): Payer: 59 | Admitting: Anesthesiology

## 2013-11-25 ENCOUNTER — Ambulatory Visit (HOSPITAL_COMMUNITY)
Admission: RE | Admit: 2013-11-25 | Discharge: 2013-11-25 | Disposition: A | Discharge status: Home / Self Care | Payer: 59 | Source: Ambulatory Visit | Attending: General Surgery | Admitting: General Surgery

## 2013-11-25 ENCOUNTER — Encounter (HOSPITAL_COMMUNITY)
Admission: RE | Disposition: A | Discharge status: Home / Self Care | Payer: Self-pay | Source: Ambulatory Visit | Attending: General Surgery

## 2013-11-25 DIAGNOSIS — R1011 Right upper quadrant pain: Secondary | ICD-10-CM | POA: Insufficient documentation

## 2013-11-25 DIAGNOSIS — R42 Dizziness and giddiness: Secondary | ICD-10-CM | POA: Insufficient documentation

## 2013-11-25 DIAGNOSIS — K811 Chronic cholecystitis: Secondary | ICD-10-CM | POA: Insufficient documentation

## 2013-11-25 DIAGNOSIS — K660 Peritoneal adhesions (postprocedural) (postinfection): Secondary | ICD-10-CM | POA: Insufficient documentation

## 2013-11-25 HISTORY — PX: CHOLECYSTECTOMY: SHX55

## 2013-11-25 SURGERY — LAPAROSCOPIC CHOLECYSTECTOMY
Anesthesia: General | Site: Abdomen

## 2013-11-25 MED ORDER — GLYCOPYRROLATE 0.2 MG/ML IJ SOLN
INTRAMUSCULAR | Status: DC | PRN
  Administered 2013-11-25: 0.4 mg via INTRAVENOUS

## 2013-11-25 MED ORDER — KETOROLAC TROMETHAMINE 30 MG/ML IJ SOLN
INTRAMUSCULAR | Status: AC
  Filled 2013-11-25: qty 1

## 2013-11-25 MED ORDER — MIDAZOLAM HCL 5 MG/5ML IJ SOLN
INTRAMUSCULAR | Status: DC | PRN
  Administered 2013-11-25: 2 mg via INTRAVENOUS

## 2013-11-25 MED ORDER — LIDOCAINE HCL 1 % IJ SOLN
INTRAMUSCULAR | Status: DC | PRN
  Administered 2013-11-25: 80 mg via INTRADERMAL

## 2013-11-25 MED ORDER — HYDROCODONE-ACETAMINOPHEN 5-325 MG PO TABS: 1.0000 | ORAL_TABLET | ORAL | Status: DC | PRN

## 2013-11-25 MED ORDER — MIDAZOLAM HCL 2 MG/2ML IJ SOLN
INTRAMUSCULAR | Status: AC
  Filled 2013-11-25: qty 2

## 2013-11-25 MED ORDER — HYDROMORPHONE HCL PF 1 MG/ML IJ SOLN
INTRAMUSCULAR | Status: AC
  Filled 2013-11-25: qty 1

## 2013-11-25 MED ORDER — DEXAMETHASONE SODIUM PHOSPHATE 10 MG/ML IJ SOLN
INTRAMUSCULAR | Status: AC
  Filled 2013-11-25: qty 1

## 2013-11-25 MED ORDER — PROPOFOL 10 MG/ML IV BOLUS
INTRAVENOUS | Status: AC
  Filled 2013-11-25: qty 20

## 2013-11-25 MED ORDER — LACTATED RINGERS IV SOLN
INTRAVENOUS | Status: DC | PRN
  Administered 2013-11-25: 1000 mL via INTRAVENOUS

## 2013-11-25 MED ORDER — FENTANYL CITRATE 0.05 MG/ML IJ SOLN
INTRAMUSCULAR | Status: DC | PRN
  Administered 2013-11-25: 100 ug via INTRAVENOUS
  Administered 2013-11-25: 50 ug via INTRAVENOUS
  Administered 2013-11-25: 100 ug via INTRAVENOUS

## 2013-11-25 MED ORDER — FENTANYL CITRATE 0.05 MG/ML IJ SOLN
INTRAMUSCULAR | Status: AC
  Filled 2013-11-25: qty 5

## 2013-11-25 MED ORDER — DEXTROSE 5 % IV SOLN
INTRAVENOUS | Status: AC
  Filled 2013-11-25 (×2): qty 1

## 2013-11-25 MED ORDER — LACTATED RINGERS IV SOLN: INTRAVENOUS | Status: DC

## 2013-11-25 MED ORDER — CISATRACURIUM BESYLATE (PF) 10 MG/5ML IV SOLN
INTRAVENOUS | Status: DC | PRN
  Administered 2013-11-25: 6 mg via INTRAVENOUS

## 2013-11-25 MED ORDER — LACTATED RINGERS IV SOLN
INTRAVENOUS | Status: DC | PRN
  Administered 2013-11-25 (×2): via INTRAVENOUS

## 2013-11-25 MED ORDER — PROPOFOL 10 MG/ML IV BOLUS
INTRAVENOUS | Status: DC | PRN
  Administered 2013-11-25: 200 mg via INTRAVENOUS

## 2013-11-25 MED ORDER — BUPIVACAINE-EPINEPHRINE 0.25% -1:200000 IJ SOLN
INTRAMUSCULAR | Status: DC | PRN
  Administered 2013-11-25: 30 mL

## 2013-11-25 MED ORDER — BUPIVACAINE-EPINEPHRINE 0.25% -1:200000 IJ SOLN
INTRAMUSCULAR | Status: AC
  Filled 2013-11-25: qty 1

## 2013-11-25 MED ORDER — ONDANSETRON HCL 4 MG/2ML IJ SOLN
INTRAMUSCULAR | Status: AC
  Filled 2013-11-25: qty 2

## 2013-11-25 MED ORDER — DEXAMETHASONE SODIUM PHOSPHATE 10 MG/ML IJ SOLN
INTRAMUSCULAR | Status: DC | PRN
  Administered 2013-11-25: 10 mg via INTRAVENOUS

## 2013-11-25 MED ORDER — LIDOCAINE HCL (CARDIAC) 20 MG/ML IV SOLN
INTRAVENOUS | Status: AC
  Filled 2013-11-25: qty 5

## 2013-11-25 MED ORDER — CISATRACURIUM BESYLATE 20 MG/10ML IV SOLN
INTRAVENOUS | Status: AC
  Filled 2013-11-25: qty 10

## 2013-11-25 MED ORDER — LACTATED RINGERS IV SOLN
INTRAVENOUS | Status: DC
Start: 2013-11-25 — End: 2013-11-25

## 2013-11-25 MED ORDER — HYDROMORPHONE HCL PF 1 MG/ML IJ SOLN
0.2500 mg | INTRAMUSCULAR | Status: DC | PRN
  Administered 2013-11-25 (×2): 0.5 mg via INTRAVENOUS

## 2013-11-25 MED ORDER — PROMETHAZINE HCL 25 MG/ML IJ SOLN
6.2500 mg | INTRAMUSCULAR | Status: DC | PRN
  Administered 2013-11-25: 6.25 mg via INTRAVENOUS
  Filled 2013-11-25: qty 1

## 2013-11-25 MED ORDER — ONDANSETRON HCL 4 MG/2ML IJ SOLN
INTRAMUSCULAR | Status: DC | PRN
  Administered 2013-11-25: 4 mg via INTRAVENOUS

## 2013-11-25 MED ORDER — CEFOXITIN SODIUM 2 G IV SOLR
2.0000 g | INTRAVENOUS | Status: AC
  Administered 2013-11-25: 2 g via INTRAVENOUS
  Filled 2013-11-25: qty 2

## 2013-11-25 MED ORDER — SUCCINYLCHOLINE CHLORIDE 20 MG/ML IJ SOLN
INTRAMUSCULAR | Status: DC | PRN
  Administered 2013-11-25: 100 mg via INTRAVENOUS

## 2013-11-25 MED ORDER — GLYCOPYRROLATE 0.2 MG/ML IJ SOLN
INTRAMUSCULAR | Status: AC
  Filled 2013-11-25: qty 2

## 2013-11-25 MED ORDER — NEOSTIGMINE METHYLSULFATE 1 MG/ML IJ SOLN
INTRAMUSCULAR | Status: DC | PRN
  Administered 2013-11-25: 2 mg via INTRAVENOUS

## 2013-11-25 SURGICAL SUPPLY — 43 items
APL SKNCLS STERI-STRIP NONHPOA (GAUZE/BANDAGES/DRESSINGS) ×1
APPLIER CLIP 5 13 M/L LIGAMAX5 (MISCELLANEOUS)
APPLIER CLIP ROT 10 11.4 M/L (STAPLE)
APR CLP MED LRG 11.4X10 (STAPLE)
APR CLP MED LRG 5 ANG JAW (MISCELLANEOUS)
BAG SPEC RTRVL LRG 6X4 10 (ENDOMECHANICALS) ×1
BANDAGE ADH SHEER 1  50/CT (GAUZE/BANDAGES/DRESSINGS) ×9 IMPLANT
BENZOIN TINCTURE PRP APPL 2/3 (GAUZE/BANDAGES/DRESSINGS) ×3 IMPLANT
CANISTER SUCTION 2500CC (MISCELLANEOUS) ×3 IMPLANT
CHLORAPREP W/TINT 26ML (MISCELLANEOUS) ×3 IMPLANT
CLIP APPLIE 5 13 M/L LIGAMAX5 (MISCELLANEOUS) IMPLANT
CLIP APPLIE ROT 10 11.4 M/L (STAPLE) IMPLANT
CLOSURE WOUND 1/2 X4 (GAUZE/BANDAGES/DRESSINGS)
COVER MAYO STAND STRL (DRAPES) ×2 IMPLANT
COVER SURGICAL LIGHT HANDLE (MISCELLANEOUS) ×3 IMPLANT
DECANTER SPIKE VIAL GLASS SM (MISCELLANEOUS) ×3 IMPLANT
DRAPE C-ARM 42X120 X-RAY (DRAPES) ×2 IMPLANT
DRAPE LAPAROSCOPIC ABDOMINAL (DRAPES) ×3 IMPLANT
DRAPE UTILITY XL STRL (DRAPES) ×3 IMPLANT
DRSG TEGADERM 2-3/8X2-3/4 SM (GAUZE/BANDAGES/DRESSINGS) ×3 IMPLANT
ELECT REM PT RETURN 9FT ADLT (ELECTROSURGICAL) ×3
ELECTRODE REM PT RTRN 9FT ADLT (ELECTROSURGICAL) ×1 IMPLANT
GLOVE BIO SURGEON STRL SZ7.5 (GLOVE) ×3 IMPLANT
GLOVE BIOGEL M STRL SZ7.5 (GLOVE) ×6 IMPLANT
GLOVE BIOGEL PI IND STRL 7.0 (GLOVE) ×1 IMPLANT
GLOVE BIOGEL PI INDICATOR 7.0 (GLOVE) ×2
GLOVE INDICATOR 8.0 STRL GRN (GLOVE) ×3 IMPLANT
GOWN STRL REUS W/TWL LRG LVL3 (GOWN DISPOSABLE) ×9 IMPLANT
GOWN STRL REUS W/TWL XL LVL3 (GOWN DISPOSABLE) ×6 IMPLANT
KIT BASIN OR (CUSTOM PROCEDURE TRAY) ×3 IMPLANT
NS IRRIG 1000ML POUR BTL (IV SOLUTION) ×3 IMPLANT
POUCH SPECIMEN RETRIEVAL 10MM (ENDOMECHANICALS) ×3 IMPLANT
SET CHOLANGIOGRAPH MIX (MISCELLANEOUS) ×2 IMPLANT
SET IRRIG TUBING LAPAROSCOPIC (IRRIGATION / IRRIGATOR) ×3 IMPLANT
SOLUTION ANTI FOG 6CC (MISCELLANEOUS) ×3 IMPLANT
STRIP CLOSURE SKIN 1/2X4 (GAUZE/BANDAGES/DRESSINGS) ×1 IMPLANT
SUT MNCRL AB 4-0 PS2 18 (SUTURE) ×3 IMPLANT
TOWEL OR 17X26 10 PK STRL BLUE (TOWEL DISPOSABLE) ×3 IMPLANT
TRAY LAP CHOLE (CUSTOM PROCEDURE TRAY) ×3 IMPLANT
TROCAR BLADELESS OPT 5 75 (ENDOMECHANICALS) ×6 IMPLANT
TROCAR XCEL BLUNT TIP 100MML (ENDOMECHANICALS) ×3 IMPLANT
TROCAR XCEL NON-BLD 11X100MML (ENDOMECHANICALS) IMPLANT
TUBING INSUFFLATION 10FT LAP (TUBING) ×3 IMPLANT

## 2013-11-25 NOTE — Interval H&P Note (Signed)
History and Physical Interval Note:  11/25/2013 11:25 AM  Amber Mitchell  has presented today for surgery, with the diagnosis of biliary dyskinesia  The various methods of treatment have been discussed with the patient and family. After consideration of risks, benefits and other options for treatment, the patient has consented to  Procedure(s): LAPAROSCOPIC CHOLECYSTECTOMY POSSIBLE IOC (N/A) as a surgical intervention .  The patient's history has been reviewed, patient examined, no change in status, stable for surgery.  I have reviewed the patient's chart and labs.  Questions were answered to the patient's satisfaction.    Mary SellaEric M. Andrey CampanileWilson, MD, FACS General, Bariatric, & Minimally Invasive Surgery [facility], [place]   [facility]IL[facility],[name] M

## 2013-11-25 NOTE — Discharge Instructions (Signed)
[facility] °LAPAROSCOPIC SURGERY: POST OP INSTRUCTIONS °Always review your discharge instruction sheet given to you by the facility where your surgery was performed. °IF YOU HAVE DISABILITY OR FAMILY LEAVE FORMS, YOU MUST BRING THEM TO THE OFFICE FOR PROCESSING.   °DO NOT GIVE THEM TO YOUR DOCTOR. ° °1. A prescription for pain medication may be given to you upon discharge.  Take your pain medication as prescribed, if needed.  If narcotic pain medicine is not needed, then you may take acetaminophen (Tylenol) or ibuprofen (Advil) as needed. °2. Take your usually prescribed medications unless otherwise directed. °3. If you need a refill on your pain medication, please contact your pharmacy.  They will contact our office to request authorization. Prescriptions will not be filled after 5pm or on week-ends. °4. You should follow a light diet the first few days after arrival home, such as soup and crackers, etc.  Be sure to include lots of fluids daily. °5. Most patients will experience some swelling and bruising in the area of the incisions.  Ice packs will help.  Swelling and bruising can take several days to resolve.  °6. It is common to experience some constipation if taking pain medication after surgery.  Increasing fluid intake and taking a stool softener (such as Colace) will usually help or prevent this problem from occurring.  A mild laxative (Milk of Magnesia or Miralax) should be taken according to package instructions if there are no bowel movements after 48 hours. °7. Unless discharge instructions indicate otherwise, you may remove your bandages 24-48 hours after surgery, and you may shower at that time.  You may have steri-strips (small skin tapes) in place directly over the incision.  These strips should be left on the skin for 7-10 days.  If your surgeon used skin glue on the incision, you may shower in 24 hours.  The glue will flake off over the next 2-3 weeks.  Any sutures or  staples will be removed at the office during your follow-up visit. °8. ACTIVITIES:  You may resume regular (light) daily activities beginning the next day--such as daily self-care, walking, climbing stairs--gradually increasing activities as tolerated.  You may have sexual intercourse when it is comfortable.  Refrain from any heavy lifting or straining until approved by your doctor. °a. You may drive when you are no longer taking prescription pain medication, you can comfortably wear a seatbelt, and you can safely maneuver your car and apply brakes. °9. You should see your doctor in the office for a follow-up appointment approximately 2-3 weeks after your surgery.  Make sure that you call for this appointment within a day or two after you arrive home to insure a convenient appointment time. °10. OTHER INSTRUCTIONS:  °WHEN TO CALL YOUR DOCTOR: °1. Fever over 101.0 °2. Inability to urinate °3. Continued bleeding from incision. °4. Increased pain, redness, or drainage from the incision. °5. Increasing abdominal pain ° °The clinic staff is available to answer your questions during regular business hours.  Please don’t hesitate to call and ask to speak to one of the nurses for clinical concerns.  If you have a medical emergency, go to the nearest emergency room or call 911.  A surgeon from [facility] is always on call at the hospital. °[address], [address], [place], [place]  [address] ? [address] [address], [place], [place]   [address] °[phone] ? [phone] ? FAX [phone] °Web site: [email or web address] ° °

## 2013-11-25 NOTE — Transfer of Care (Signed)
Immediate Anesthesia Transfer of Care Note  Patient: Drake LeachKelly E Schuff  Procedure(s) Performed: Procedure(s): LAPAROSCOPIC CHOLECYSTECTOMY (N/A)  Patient Location: PACU  Anesthesia Type:General  Level of Consciousness: awake, alert  and oriented  Airway & Oxygen Therapy: Patient Spontanous Breathing and Patient connected to face mask oxygen  Post-op Assessment: Report given to PACU RN  Post vital signs: Reviewed and stable  Complications: No apparent anesthesia complications

## 2013-11-25 NOTE — H&P (View-Only) (Signed)
Patient ID: Amber LeachKelly E Leiker, female   DOB: 1991/06/16, 23 y.o.   MRN: [id number]  Chief Complaint  Patient presents with  . Abdominal Pain    gallstone    HPI Amber Mitchell is a 23 y.o. female.   Abdominal Pain Associated symptoms: no chest pain, no dysuria, no fever and no shortness of breath   23 year old Caucasian female referred by Dr. Milinda CaveMcGowen for evaluation of right upper quadrant pain. The patient started having problems around Christmas. She describes pain in her right upper abdomen radiating to her side. It primarily occurs in the evening. It is associated with nausea and sometimes emesis. At one point the pain got so severe it prompted her to go to the emergency room. Labs and ultrasound were performed. She had a mild white count of around 15,000. She followed up with her primary care physician. She had Ongoing symptoms therefore a nuclear medicine scan was obtained. She had a low gallbladder ejection fraction. She has noticed a correlation with certain foods. Foods like hot dogs and eggs tend to trigger an episode. She denies any jaundice, acholic stools, NSAID use, constipation. She has had some loose stool. Today she had some blood parenchyma. She has a strong family history of gallbladder problems. Her mother, sister, and grandmother have all had their gallbladders removed. She denies any weight loss.  Past Medical History  Diagnosis Date  . Overweight 12/26/2011  . Urticaria 05/20/2012  . Anxiety 06/04/2012  . Headache(784.0) 06/17/2012  . Vertigo 06/17/2012  . Rash     back, leg toes, eyelids    Past Surgical History  Procedure Laterality Date  . No surgical history  Aug 2013    Family History  Problem Relation Age of Onset  . Hypertension Maternal Grandmother   . Migraines Maternal Grandmother     Social History History  Substance Use Topics  . Smoking status: Never Smoker   . Smokeless tobacco: Never Used  . Alcohol Use: No    No Known Allergies  Current  Outpatient Prescriptions  Medication Sig Dispense Refill  . HYDROcodone-acetaminophen (NORCO) 5-325 MG per tablet Take 2 tablets by mouth every 4 (four) hours as needed.  30 tablet  0  . omeprazole (PRILOSEC) 20 MG capsule Take 1 capsule (20 mg total) by mouth daily.  5 capsule  0  . ondansetron (ZOFRAN ODT) 8 MG disintegrating tablet 8mg  ODT q4 hours prn nausea  20 tablet  1   No current facility-administered medications for this visit.    Review of Systems Review of Systems  Constitutional: Negative for fever, activity change, appetite change and unexpected weight change.  HENT: Negative for nosebleeds and trouble swallowing.   Eyes: Negative for photophobia and visual disturbance.  Respiratory: Negative for chest tightness and shortness of breath.   Cardiovascular: Negative for chest pain and leg swelling.       Denies CP, SOB, orthopnea, PND, DOE  Gastrointestinal: Positive for abdominal pain.  Genitourinary: Negative for dysuria and difficulty urinating.  Musculoskeletal: Negative for arthralgias.  Skin: Negative for pallor and rash.  Neurological: Negative for dizziness, seizures, facial asymmetry and numbness.          Hematological: Negative for adenopathy. Does not bruise/bleed easily.  Psychiatric/Behavioral: Negative for behavioral problems and agitation.    Blood pressure 122/80, pulse 86, temperature 97.7 F (36.5 C), temperature source Temporal, resp. rate 16, height 5\' 8"  (1.727 m), weight 236 lb 9.6 oz (107.321 kg), last menstrual period 10/17/2013.  Physical Exam Physical  Exam  Vitals reviewed. Constitutional: She is oriented to person, place, and time. She appears well-developed and well-nourished. No distress.  obese  HENT:  Head: Normocephalic and atraumatic.  Right Ear: External ear normal.  Left Ear: External ear normal.  Eyes: Conjunctivae are normal. No scleral icterus.  Neck: Normal range of motion. Neck supple. No tracheal deviation present. No  thyromegaly present.  Cardiovascular: Normal rate and normal heart sounds.   Pulmonary/Chest: Effort normal and breath sounds normal. No stridor. No respiratory distress. She has no wheezes.  Abdominal: Soft. She exhibits no distension. There is tenderness (mild) in the right upper quadrant. There is no rebound and no guarding.  Musculoskeletal: She exhibits no edema and no tenderness.  Lymphadenopathy:    She has no cervical adenopathy.  Neurological: She is alert and oriented to person, place, and time. She exhibits normal muscle tone.  Skin: Skin is warm and dry. No rash noted. She is not diaphoretic. No erythema.  Psychiatric: She has a normal mood and affect. Her behavior is normal. Judgment and thought content normal.    Data Reviewed McGowen note 1/9 & 12/29 Bednar note U/s abd 12/26 - wnl cmet 1/9 and 12/26 wnl Cbc 1/9 and 12/26 - wbc normalized on f/u HIDA - ducts patent, EF 26%  Assessment    RUQ pain Biliary dyskinesia     Plan    I believe the patient's symptoms are consistent with gallbladder disease.  We discussed gallbladder disease. The patient was given Agricultural engineer. We discussed non-operative and operative management. We discussed the signs & symptoms of acute cholecystitis  I discussed laparoscopic cholecystectomy with IOC in detail.  The patient was given educational material as well as diagrams detailing the procedure.  We discussed the risks and benefits of a laparoscopic cholecystectomy including, but not limited to bleeding, infection, injury to surrounding structures such as the intestine or liver, bile leak, retained gallstones, need to convert to an open procedure, failure to ameliorate her pain, prolonged diarrhea, blood clots such as  DVT, common bile duct injury, anesthesia risks, and possible need for additional procedures.  We discussed the typical post-operative recovery course. I explained that the likelihood of improvement of their symptoms  is good.   She has elected to proceed with surgery.  Mary Sella. Andrey Campanile, MD, FACS General, Bariatric, & Minimally Invasive Surgery [facility], [place]        [facility] M 11/21/2013, 2:04 PM

## 2013-11-25 NOTE — Anesthesia Preprocedure Evaluation (Signed)
Anesthesia Evaluation  Patient identified by MRN, date of birth, ID band Patient awake    Reviewed: Allergy & Precautions, H&P , NPO status , Patient's Chart, lab work & pertinent test results  Airway Mallampati: II TM Distance: >3 FB Neck ROM: Full    Dental no notable dental hx.    Pulmonary neg pulmonary ROS,  breath sounds clear to auscultation  Pulmonary exam normal       Cardiovascular negative cardio ROS  Rhythm:Regular Rate:Normal     Neuro/Psych negative neurological ROS  negative psych ROS   GI/Hepatic negative GI ROS, Neg liver ROS,   Endo/Other  Morbid obesity  Renal/GU negative Renal ROS  negative genitourinary   Musculoskeletal negative musculoskeletal ROS (+)   Abdominal   Peds negative pediatric ROS (+)  Hematology negative hematology ROS (+)   Anesthesia Other Findings   Reproductive/Obstetrics negative OB ROS                           Anesthesia Physical Anesthesia Plan  ASA: II  Anesthesia Plan: General   Post-op Pain Management:    Induction: Intravenous  Airway Management Planned: Oral ETT  Additional Equipment:   Intra-op Plan:   Post-operative Plan: Extubation in OR  Informed Consent: I have reviewed the patients History and Physical, chart, labs and discussed the procedure including the risks, benefits and alternatives for the proposed anesthesia with the patient or authorized representative who has indicated his/her understanding and acceptance.   Dental advisory given  Plan Discussed with: CRNA and Surgeon  Anesthesia Plan Comments:         Anesthesia Quick Evaluation

## 2013-11-25 NOTE — Op Note (Signed)
Laparoscopic Cholecystectomy  Procedure Note  Indications: This patient presents with symptomatic RUQ pain and an abnormal HIDA scan and will undergo laparoscopic cholecystectomy.  Pre-operative Diagnosis: Abdominal pain, right upper quadrant  Post-operative Diagnosis: Same  Surgeon: Atilano Ina   Assistants: Ovidio Kin  Anesthesia: General endotracheal anesthesia  ASA Class: 2  Procedure Details  The patient was seen again in the Holding Room. The risks, benefits, complications, treatment options, and expected outcomes were discussed with the patient. The possibilities of reaction to medication, pulmonary aspiration, perforation of viscus, bleeding, recurrent infection, finding a normal gallbladder, the need for additional procedures, failure to diagnose a condition, the possible need to convert to an open procedure, and creating a complication requiring transfusion or operation were discussed with the patient. The likelihood of improving the patient's symptoms with return to their baseline status is good.  The patient and/or family concurred with the proposed plan, giving informed consent. The site of surgery properly noted. The patient was taken to Operating Room, identified as Amber Mitchell and the procedure verified as Laparoscopic Cholecystectomy with possible Intraoperative Cholangiogram. A Time Out was held and the above information confirmed.  Prior to the induction of general anesthesia, antibiotic prophylaxis was administered. General endotracheal anesthesia was then administered and tolerated well. After the induction, the abdomen was prepped with Chloraprep and draped in the sterile fashion. The patient was positioned in the supine position.  Local anesthetic agent was injected into the skin near the umbilicus and an incision made. We dissected down to the abdominal fascia with blunt dissection.  The fascia was incised vertically and we entered the peritoneal cavity bluntly.   A pursestring suture of 0-Vicryl was placed around the fascial opening.  The Hasson cannula was inserted and secured with the stay suture.  Pneumoperitoneum was then created with CO2 and tolerated well without any adverse changes in the patient's vital signs. An 5-mm port was placed in the subxiphoid position.  Two 5-mm ports were placed in the right upper quadrant. All skin incisions were infiltrated with a local anesthetic agent before making the incision and placing the trocars.   We positioned the patient in reverse Trendelenburg, tilted slightly to the patient's left.  The gallbladder was identified, the fundus grasped and retracted cephalad. Adhesions were lysed bluntly and with the electrocautery where indicated, taking care not to injure any adjacent organs or viscus. The infundibulum was grasped and retracted laterally, exposing the peritoneum overlying the triangle of Calot. This was then divided and exposed in a blunt fashion. A critical view of the cystic duct and cystic artery was obtained.  The cystic duct was clearly identified and bluntly dissected circumferentially.  The cystic duct was then ligated with 3 clips on the biliary aspect and 1 distally and divided. The cystic artery was identified, dissected free, ligated with clips and divided as well.   The gallbladder was dissected from the liver bed in retrograde fashion with the electrocautery. The gallbladder was removed and placed in an Endocatch sac.  The gallbladder and Endocatch sac were then removed through the umbilical port site. The liver bed was irrigated and inspected. Hemostasis was achieved with the electrocautery. Copious irrigation was utilized and was repeatedly aspirated until clear.  The pursestring suture was used to close the umbilical fascia.    We again inspected the right upper quadrant for hemostasis.  The umbilical closure was inspected and there was no air leak and nothing trapped within the closure.  Pneumoperitoneum was released as we  removed the trocars. There was a little bit of bleeding from the right lateral abdominal wall trocar site which was cauterized.  4-0 Monocryl was used to close the skin.   Dermabond was applied. The patient was then extubated and brought to the recovery room in stable condition. Instrument, sponge, and needle counts were correct at closure and at the conclusion of the case.   Findings: +critical view, a few omental adhesions to GB  Estimated Blood Loss: Minimal         Drains: none         Specimens: Gallbladder           Complications: None; patient tolerated the procedure well.         Disposition: PACU - hemodynamically stable.         Condition: stable  Mary SellaEric M. Andrey CampanileWilson, MD, FACS General, Bariatric, & Minimally Invasive Surgery [facility] [place]

## 2013-11-26 ENCOUNTER — Encounter (HOSPITAL_COMMUNITY): Payer: Self-pay | Admitting: General Surgery

## 2013-11-26 NOTE — Anesthesia Postprocedure Evaluation (Signed)
  Anesthesia Post-op Note  Patient: Drake LeachKelly E Besson  Procedure(s) Performed: Procedure(s) (LRB): LAPAROSCOPIC CHOLECYSTECTOMY (N/A)  Patient Location: PACU  Anesthesia Type: General  Level of Consciousness: awake and alert   Airway and Oxygen Therapy: Patient Spontanous Breathing  Post-op Pain: mild  Post-op Assessment: Post-op Vital signs reviewed, Patient's Cardiovascular Status Stable, Respiratory Function Stable, Patent Airway and No signs of Nausea or vomiting  Last Vitals:  Filed Vitals:   11/25/13 1707  BP: 105/67  Pulse:   Temp:   Resp:     Post-op Vital Signs: stable   Complications: No apparent anesthesia complications

## 2013-12-02 ENCOUNTER — Telehealth (INDEPENDENT_AMBULATORY_CARE_PROVIDER_SITE_OTHER): Payer: Self-pay | Admitting: General Surgery

## 2013-12-02 ENCOUNTER — Encounter (HOSPITAL_COMMUNITY): Payer: Self-pay | Admitting: Emergency Medicine

## 2013-12-02 ENCOUNTER — Emergency Department (HOSPITAL_COMMUNITY): Payer: 59

## 2013-12-02 ENCOUNTER — Emergency Department (HOSPITAL_COMMUNITY)
Admission: EM | Admit: 2013-12-02 | Discharge: 2013-12-02 | Disposition: A | Discharge status: Home / Self Care | Payer: 59 | Attending: Emergency Medicine | Admitting: Emergency Medicine

## 2013-12-02 DIAGNOSIS — R112 Nausea with vomiting, unspecified: Secondary | ICD-10-CM | POA: Insufficient documentation

## 2013-12-02 DIAGNOSIS — R748 Abnormal levels of other serum enzymes: Secondary | ICD-10-CM | POA: Insufficient documentation

## 2013-12-02 DIAGNOSIS — R0602 Shortness of breath: Secondary | ICD-10-CM | POA: Insufficient documentation

## 2013-12-02 DIAGNOSIS — Z3202 Encounter for pregnancy test, result negative: Secondary | ICD-10-CM | POA: Insufficient documentation

## 2013-12-02 DIAGNOSIS — R1011 Right upper quadrant pain: Secondary | ICD-10-CM | POA: Insufficient documentation

## 2013-12-02 DIAGNOSIS — Z8719 Personal history of other diseases of the digestive system: Secondary | ICD-10-CM | POA: Insufficient documentation

## 2013-12-02 DIAGNOSIS — N83202 Unspecified ovarian cyst, left side: Secondary | ICD-10-CM

## 2013-12-02 DIAGNOSIS — G8918 Other acute postprocedural pain: Secondary | ICD-10-CM | POA: Insufficient documentation

## 2013-12-02 DIAGNOSIS — R630 Anorexia: Secondary | ICD-10-CM | POA: Insufficient documentation

## 2013-12-02 DIAGNOSIS — R6883 Chills (without fever): Secondary | ICD-10-CM | POA: Insufficient documentation

## 2013-12-02 HISTORY — DX: Unspecified ovarian cyst, left side: N83.202

## 2013-12-02 LAB — COMPREHENSIVE METABOLIC PANEL
ALT: 335 U/L — ABNORMAL HIGH (ref 0–35)
AST: 224 U/L — ABNORMAL HIGH (ref 0–37)
Albumin: 4.2 g/dL (ref 3.5–5.2)
Alkaline Phosphatase: 241 U/L — ABNORMAL HIGH (ref 39–117)
BUN: 13 mg/dL (ref 6–23)
CO2: 23 mEq/L (ref 19–32)
Calcium: 9.3 mg/dL (ref 8.4–10.5)
Chloride: 101 mEq/L (ref 96–112)
Creatinine, Ser: 0.74 mg/dL (ref 0.50–1.10)
GFR calc Af Amer: >90 mL/min (ref >90)
GFR calc non Af Amer: >90 mL/min (ref >90)
Glucose, Bld: 98 mg/dL (ref 70–99)
Potassium: 4.7 mEq/L (ref 3.7–5.3)
Sodium: 138 mEq/L (ref 137–147)
Total Bilirubin: 0.5 mg/dL (ref 0.3–1.2)
Total Protein: 8.3 g/dL (ref 6.0–8.3)

## 2013-12-02 LAB — CBC WITH DIFFERENTIAL/PLATELET
Basophils Absolute: 0 10*3/uL (ref 0.0–0.1)
Basophils Relative: 0 % (ref 0–1)
Eosinophils Absolute: 0.1 10*3/uL (ref 0.0–0.7)
Eosinophils Relative: 0 % (ref 0–5)
HCT: 40.7 % (ref 36.0–46.0)
Hemoglobin: 13.7 g/dL (ref 12.0–15.0)
Lymphocytes Relative: 8 % — ABNORMAL LOW (ref 12–46)
Lymphs Abs: 1.3 10*3/uL (ref 0.7–4.0)
MCH: 27.7 pg (ref 26.0–34.0)
MCHC: 33.7 g/dL (ref 30.0–36.0)
MCV: 82.2 fL (ref 78.0–100.0)
Monocytes Absolute: 0.7 10*3/uL (ref 0.1–1.0)
Monocytes Relative: 5 % (ref 3–12)
Neutro Abs: 13.4 10*3/uL — ABNORMAL HIGH (ref 1.7–7.7)
Neutrophils Relative %: 87 % — ABNORMAL HIGH (ref 43–77)
Platelets: 341 10*3/uL (ref 150–400)
RBC: 4.95 MIL/uL (ref 3.87–5.11)
RDW: 13.4 % (ref 11.5–15.5)
WBC: 15.5 10*3/uL — ABNORMAL HIGH (ref 4.0–10.5)

## 2013-12-02 LAB — URINALYSIS, ROUTINE W REFLEX MICROSCOPIC
Glucose, UA: NEGATIVE mg/dL
Hgb urine dipstick: NEGATIVE
Ketones, ur: 40 mg/dL — AB
Leukocytes, UA: NEGATIVE
Nitrite: NEGATIVE
Protein, ur: NEGATIVE mg/dL
Specific Gravity, Urine: 1.027 (ref 1.005–1.030)
Urobilinogen, UA: 0.2 mg/dL (ref 0.0–1.0)
pH: 5.5 (ref 5.0–8.0)

## 2013-12-02 LAB — LIPASE, BLOOD: Lipase: 26 U/L (ref 11–59)

## 2013-12-02 LAB — POCT PREGNANCY, URINE: Preg Test, Ur: NEGATIVE

## 2013-12-02 LAB — ACETAMINOPHEN LEVEL

## 2013-12-02 MED ORDER — SODIUM CHLORIDE 0.9 % IV BOLUS (SEPSIS)
1000.0000 mL | Freq: Once | INTRAVENOUS | Status: AC
  Administered 2013-12-02: 1000 mL via INTRAVENOUS

## 2013-12-02 MED ORDER — IOHEXOL 300 MG/ML  SOLN
50.0000 mL | Freq: Once | INTRAMUSCULAR | Status: AC | PRN
  Administered 2013-12-02: 50 mL via ORAL

## 2013-12-02 MED ORDER — IOHEXOL 300 MG/ML  SOLN
100.0000 mL | Freq: Once | INTRAMUSCULAR | Status: AC | PRN
  Administered 2013-12-02: 100 mL via INTRAVENOUS

## 2013-12-02 MED ORDER — PROMETHAZINE HCL 25 MG/ML IJ SOLN
12.5000 mg | Freq: Once | INTRAMUSCULAR | Status: AC
  Administered 2013-12-02: 12.5 mg via INTRAVENOUS
  Filled 2013-12-02: qty 1

## 2013-12-02 MED ORDER — MORPHINE SULFATE 4 MG/ML IJ SOLN
4.0000 mg | Freq: Once | INTRAMUSCULAR | Status: AC
  Administered 2013-12-02: 4 mg via INTRAVENOUS
  Filled 2013-12-02: qty 1

## 2013-12-02 MED ORDER — PROMETHAZINE HCL 25 MG RE SUPP: 25.0000 mg | Freq: Four times a day (QID) | RECTAL | Status: DC | PRN

## 2013-12-02 MED ORDER — PROMETHAZINE HCL 25 MG PO TABS: 25.0000 mg | ORAL_TABLET | Freq: Once | ORAL | Status: DC

## 2013-12-02 MED ORDER — ONDANSETRON HCL 4 MG/2ML IJ SOLN
4.0000 mg | Freq: Once | INTRAMUSCULAR | Status: DC
  Filled 2013-12-02: qty 2

## 2013-12-02 NOTE — ED Notes (Signed)
Pt states that she has been vomiting approx 6 times today and having chills/shakes. Pt had her gallbladder removed on 11/25/13.  Pt denies any fevers. Pt's mother contacted surgeons office and they referred her to come here for further eval.

## 2013-12-02 NOTE — Discharge Instructions (Signed)

## 2013-12-02 NOTE — Telephone Encounter (Signed)
Pt's Mom called to report ongoing nausea and vomiting with daughter following surgery.  She is using Zofran, but with poor effects.  She has had only one day since getting home that she has not had symptoms of N/V.  Discussed plan to deal with vomitting and then slow progression of po intake.  Pt instructed to observe strict NPO for two hours following vomitting, then sips of either very cold (over crushed ice) or very hot (boiling) fluid (1 tbsp Q15 min) for 1 hours.  If this is tolerated without vomiting, increase to 1 oz Q3515min x 1 hour, and so forth.  Once the fluids are tolereated, slowly add simple carbohydrates (crackers, dry toast, oatmeal, rice, etc) to po intake.  Mother understands and will pass this on

## 2013-12-02 NOTE — Telephone Encounter (Signed)
Pt's mother called back to inform [facility] she is [facility].  Pt is still vomiting and now shaking and weak.

## 2013-12-02 NOTE — ED Provider Notes (Signed)
CSN: [id number]     Arrival date & time 12/02/13  1403 History   First MD Initiated Contact with Patient 12/02/13 1603     Chief Complaint  Patient presents with  . Emesis  . Chills   (Consider location/radiation/quality/duration/timing/severity/associated sxs/prior Treatment) HPI  Pt is a 23 yo female with a hx of gallbladder removal on 11/25/13.  She reports intermittent N/V since the procedure, worst last night into this morning.  She states she has taken Zofran today @ 0630 and 1030 but has still vomited 6-7 times.  She denies fever or chest pain.  She does report one episode of chills and SOB earlier today, but none since.  She denies diarrhea and had a normal BM without blood yesterday evening.  Her last urine was approx 1 hr ago, with no complaints.  She also states she has been taking NORCO for the pain and her LMP was x3 weeks.  She states there is no chance she could be pregnant.  Pts mother spoke to the surgeons nurse via telephone, and it was recommended they come to the ER.  Past Medical History  Diagnosis Date  . Headache(784.0)     no recent problems   . Chronic cholecystitis without calculus 2014/15    cholecystectomy   Past Surgical History  Procedure Laterality Date  . No surgical history  Aug 2013  . No past surgeries    . Cholecystectomy N/A 11/25/2013    Procedure: LAPAROSCOPIC CHOLECYSTECTOMY;  Surgeon: Atilano InaEric M Wilson, MD;  Location: [facility];  Service: General;  Laterality: N/A;   Family History  Problem Relation Age of Onset  . Hypertension Maternal Grandmother   . Migraines Maternal Grandmother    History  Substance Use Topics  . Smoking status: Never Smoker   . Smokeless tobacco: Never Used  . Alcohol Use: No   OB History   Grav Para Term Preterm Abortions TAB SAB Ect Mult Living                 Review of Systems  Constitutional: Positive for chills and appetite change. Negative for fever, diaphoresis and fatigue.  Respiratory: Positive for shortness  of breath. Negative for cough and chest tightness.   Cardiovascular: Negative for chest pain and leg swelling.  Gastrointestinal: Positive for nausea, vomiting and abdominal pain. Negative for diarrhea, constipation, blood in stool and abdominal distention.  Genitourinary: Negative for dysuria, urgency, frequency and vaginal discharge.  Neurological: Negative for headaches.    Allergies  Review of patient's allergies indicates no known allergies.  Home Medications   Current Outpatient Rx  Name  Route  Sig  Dispense  Refill  . HYDROcodone-acetaminophen (NORCO) 5-325 MG per tablet   Oral   Take 1-2 tablets by mouth every 4 (four) hours as needed for moderate pain.   40 tablet   0   . naproxen sodium (ANAPROX) 220 MG tablet   Oral   Take 220 mg by mouth daily as needed (for pain).         . ondansetron (ZOFRAN-ODT) 8 MG disintegrating tablet   Oral   Take 8 mg by mouth every 4 (four) hours as needed for nausea or vomiting.          BP 127/64  Pulse 98  Temp(Src) 98.5 F (36.9 C) (Oral)  Resp 20  SpO2 99% Physical Exam  General:  WDWN in NAD. Standing when I entered the room. Heart:  No MGR noted. Lungs:  Clear to auscultation A/P bilaterally.  No accessory muscle use.  Effort normal. Abdomen:  4 surgical sites with steri-strips still intact.  Bowel sounds present in all quadrants.  Pt reports a sharp pain in her right side when LLQ is initially palpated deeply, but this is non-reproducible.  Surgical sites our non-edematous, non-erythematous, and are not hot to the touch. No rebound tenderness. Generalized soreness around surgical sites.  No apparent Mcburneys point tenderness.  ED Course  Procedures (including critical care time) Labs Review Labs Reviewed  CBC WITH DIFFERENTIAL  COMPREHENSIVE METABOLIC PANEL  LIPASE, BLOOD  URINALYSIS, ROUTINE W REFLEX MICROSCOPIC   Results for orders placed during the hospital encounter of 12/02/13  CBC WITH DIFFERENTIAL       Result Value Range   WBC 15.5 (*) 4.0 - 10.5 K/uL   RBC 4.95  3.87 - 5.11 MIL/uL   Hemoglobin 13.7  12.0 - 15.0 g/dL   HCT 16.1  09.6 - 04.5 %   MCV 82.2  78.0 - 100.0 fL   MCH 27.7  26.0 - 34.0 pg   MCHC 33.7  30.0 - 36.0 g/dL   RDW 40.9  81.1 - 91.4 %   Platelets 341  150 - 400 K/uL   Neutrophils Relative % 87 (*) 43 - 77 %   Neutro Abs 13.4 (*) 1.7 - 7.7 K/uL   Lymphocytes Relative 8 (*) 12 - 46 %   Lymphs Abs 1.3  0.7 - 4.0 K/uL   Monocytes Relative 5  3 - 12 %   Monocytes Absolute 0.7  0.1 - 1.0 K/uL   Eosinophils Relative 0  0 - 5 %   Eosinophils Absolute 0.1  0.0 - 0.7 K/uL   Basophils Relative 0  0 - 1 %   Basophils Absolute 0.0  0.0 - 0.1 K/uL  COMPREHENSIVE METABOLIC PANEL      Result Value Range   Sodium 138  137 - 147 mEq/L   Potassium 4.7  3.7 - 5.3 mEq/L   Chloride 101  96 - 112 mEq/L   CO2 23  19 - 32 mEq/L   Glucose, Bld 98  70 - 99 mg/dL   BUN 13  6 - 23 mg/dL   Creatinine, Ser 7.82  0.50 - 1.10 mg/dL   Calcium 9.3  8.4 - 95.6 mg/dL   Total Protein 8.3  6.0 - 8.3 g/dL   Albumin 4.2  3.5 - 5.2 g/dL   AST 213 (*) 0 - 37 U/L   ALT 335 (*) 0 - 35 U/L   Alkaline Phosphatase 241 (*) 39 - 117 U/L   Total Bilirubin 0.5  0.3 - 1.2 mg/dL   GFR calc non Af Amer >90  >90 mL/min   GFR calc Af Amer >90  >90 mL/min  LIPASE, BLOOD      Result Value Range   Lipase 26  11 - 59 U/L  URINALYSIS, ROUTINE W REFLEX MICROSCOPIC      Result Value Range   Color, Urine YELLOW  YELLOW   APPearance CLEAR  CLEAR   Specific Gravity, Urine 1.027  1.005 - 1.030   pH 5.5  5.0 - 8.0   Glucose, UA NEGATIVE  NEGATIVE mg/dL   Hgb urine dipstick NEGATIVE  NEGATIVE   Bilirubin Urine SMALL (*) NEGATIVE   Ketones, ur 40 (*) NEGATIVE mg/dL   Protein, ur NEGATIVE  NEGATIVE mg/dL   Urobilinogen, UA 0.2  0.0 - 1.0 mg/dL   Nitrite NEGATIVE  NEGATIVE   Leukocytes, UA NEGATIVE  NEGATIVE  ACETAMINOPHEN  LEVEL      Result Value Range   Acetaminophen (Tylenol), Serum <15.0  10 - 30 ug/mL  POCT  PREGNANCY, URINE      Result Value Range   Preg Test, Ur NEGATIVE  NEGATIVE   Nm Hepatobiliary  11/14/2013   CLINICAL DATA:  Postprandial abdominal pain in the right upper quadrant  EXAM: NUCLEAR MEDICINE HEPATOBILIARY IMAGING WITH GALLBLADDER EF  TECHNIQUE: Sequential images of the abdomen were obtained out to 60 minutes following intravenous administration of radiopharmaceutical. After oral ingestion of Ensure, gallbladder ejection fraction was determined. At 60 min, normal ejection fraction is greater than 33%.  COMPARISON:  None.  RADIOPHARMACEUTICALS:  5. Tc-40m Choletec  FINDINGS: There is immediate homogeneous uptake of radiotracer in the liver. Filling of the gallbladder begins at 10 minutes. Radiotracer uptake is present in the small bowel at 35 minutes.  When gallbladder filling was complete, the patient was given PO Ensure. Gallbladder ejection fraction: 26.9%. Normal gallbladder ejection fraction with Ensure is greater than 33%. The patient did experience symptoms after oral ingestion of Ensure.  IMPRESSION: 1. No scintigraphic evidence of biliary obstruction. 2. Abnormal decreased gallbladder ejection fraction as can be seen with biliary dyskinesia.   Electronically Signed   By: Elige Ko   On: 11/14/2013 12:34   Ct Abdomen Pelvis W Contrast  12/02/2013   CLINICAL DATA:  Nausea, vomiting, upper abdominal pain, prior cholecystectomy  EXAM: CT ABDOMEN AND PELVIS WITH CONTRAST  TECHNIQUE: Multidetector CT imaging of the abdomen and pelvis was performed using the standard protocol following bolus administration of intravenous contrast.  CONTRAST:  OMNIPAQUE IOHEXOL 300 MG/ML  SOLN  COMPARISON:  None.  FINDINGS: Minor basilar atelectasis. No lower lobe pneumonia. Normal heart size. No pericardial or pleural effusion.  Abdomen: Prior cholecystectomy. Focal fatty infiltration of the liver along the falciform ligament anteriorly, image 16. No other hepatic abnormality. Patent hepatic and  portal veins. No biliary dilatation. Biliary system, pancreas, spleen, adrenal glands, and kidneys are within normal limits for age and demonstrate no acute process.  Negative for aneurysm or acute vascular finding.  Negative for bowel obstruction, dilatation, ileus, or free air.  No abdominal free fluid, fluid collection, hemorrhage, abscess, or adenopathy.  Normal appendix  Pelvis: Urinary bladder and uterus are in the midline and unremarkable. Left adnexa ovarian cyst noted with peripheral enhancement measuring 28 x 29 mm, image 77. Small amount of dependent free fluid. Findings suspicious for a left ovarian cyst rupture, possibly hemorrhagic. No acute distal bowel process. Enlarged left external iliac lymph node present measuring 32 x 14 mm, image 83. No inguinal adenopathy.  No acute osseous finding.  IMPRESSION: Prior cholecystectomy.  Normal appendix  3 cm left ovarian cyst with peripheral enhancement suspicious for rupture and possibly hemorrhagic.  Left external iliac adenopathy, nonspecific   Electronically Signed   By: Ruel Favors M.D.   On: 12/02/2013 19:37   Imaging Review No results found.  EKG Interpretation   None       MDM  No diagnosis found. 1. Post-operative pain 2. Nausea and vomiting 3. Elevated liver enzymes  Re-evaluation: pain is improved. She is ambulatory to bathroom with minimal assistance. CT scan pending for evaluation of post-operative leukocytosis, transaminitis and current symptoms of vomiting and increased pain.   CT scan essentially normal, no acute findings to suggest post-op abscess. Pain is improved. She is stable for discharge home.  Arnoldo Hooker, PA-C 12/02/13 2034

## 2013-12-03 ENCOUNTER — Encounter: Payer: Self-pay | Admitting: Family Medicine

## 2013-12-03 NOTE — ED Provider Notes (Signed)
Medical screening examination/treatment/procedure(s) were performed by non-physician practitioner and as supervising physician I was immediately available for consultation/collaboration.  EKG Interpretation   None        Yarianna Varble, MD 12/03/13 1616 

## 2013-12-04 ENCOUNTER — Ambulatory Visit (INDEPENDENT_AMBULATORY_CARE_PROVIDER_SITE_OTHER): Payer: 59 | Admitting: Surgery

## 2013-12-18 ENCOUNTER — Encounter (INDEPENDENT_AMBULATORY_CARE_PROVIDER_SITE_OTHER): Payer: Self-pay | Admitting: General Surgery

## 2013-12-18 ENCOUNTER — Ambulatory Visit (INDEPENDENT_AMBULATORY_CARE_PROVIDER_SITE_OTHER): Payer: Commercial Managed Care - PPO | Admitting: General Surgery

## 2013-12-18 VITALS — BP 128/84 | HR 92 | Temp 98.5°F | Resp 15 | Ht 68.0 in | Wt 229.2 lb

## 2013-12-18 DIAGNOSIS — R7989 Other specified abnormal findings of blood chemistry: Secondary | ICD-10-CM | POA: Insufficient documentation

## 2013-12-18 DIAGNOSIS — R945 Abnormal results of liver function studies: Principal | ICD-10-CM

## 2013-12-18 DIAGNOSIS — Z09 Encounter for follow-up examination after completed treatment for conditions other than malignant neoplasm: Secondary | ICD-10-CM

## 2013-12-18 LAB — COMPREHENSIVE METABOLIC PANEL
ALBUMIN: 4.1 g/dL (ref 3.5–5.2)
ALT: 26 U/L (ref 0–35)
AST: 20 U/L (ref 0–37)
Alkaline Phosphatase: 81 U/L (ref 39–117)
BUN: 9 mg/dL (ref 6–23)
CALCIUM: 9.4 mg/dL (ref 8.4–10.5)
CHLORIDE: 104 meq/L (ref 96–112)
CO2: 27 mEq/L (ref 19–32)
Creat: 0.64 mg/dL (ref 0.50–1.10)
Glucose, Bld: 93 mg/dL (ref 70–99)
Potassium: 4.3 mEq/L (ref 3.5–5.3)
SODIUM: 142 meq/L (ref 135–145)
TOTAL PROTEIN: 7.2 g/dL (ref 6.0–8.3)
Total Bilirubin: 0.3 mg/dL (ref 0.2–1.2)

## 2013-12-18 LAB — CBC WITH DIFFERENTIAL/PLATELET
Basophils Absolute: 0 10*3/uL (ref 0.0–0.1)
Basophils Relative: 0 % (ref 0–1)
Eosinophils Absolute: 0.2 10*3/uL (ref 0.0–0.7)
Eosinophils Relative: 2 % (ref 0–5)
HEMATOCRIT: 39 % (ref 36.0–46.0)
HEMOGLOBIN: 13.1 g/dL (ref 12.0–15.0)
LYMPHS PCT: 26 % (ref 12–46)
Lymphs Abs: 2.1 10*3/uL (ref 0.7–4.0)
MCH: 27.2 pg (ref 26.0–34.0)
MCHC: 33.6 g/dL (ref 30.0–36.0)
MCV: 80.9 fL (ref 78.0–100.0)
MONO ABS: 0.6 10*3/uL (ref 0.1–1.0)
MONOS PCT: 8 % (ref 3–12)
NEUTROS PCT: 64 % (ref 43–77)
Neutro Abs: 5.2 10*3/uL (ref 1.7–7.7)
Platelets: 404 10*3/uL — ABNORMAL HIGH (ref 150–400)
RBC: 4.82 MIL/uL (ref 3.87–5.11)
RDW: 14.2 % (ref 11.5–15.5)
WBC: 8.1 10*3/uL (ref 4.0–10.5)

## 2013-12-18 NOTE — Patient Instructions (Signed)
Please get your labs drawn - we will call you with the results

## 2013-12-18 NOTE — Progress Notes (Signed)
Subjective:     Patient ID: Amber Mitchell, female   DOB: [birth year], 23 y.o.   MRN: [id number]  HPI 23 year old obese Caucasian female comes in for followup after undergoing laparoscopic cholecystectomy on January 20 for right upper quadrant pain. Her operative course was unremarkable. About a week after surgery on January 27 she had multiple sets of nausea and vomiting and felt dehydrated which prompted her to go to the emergency room. Her white blood cell count was elevated at 15,000. Her transaminases were elevated. Her AST was 224. Her ALT was 335. Her alkaline phosphatase is 241. A CT scan of her abdomen pelvis was performed which was essentially unremarkable except for a 3 cm left ovarian cyst that might have ruptured. Her nausea and vomiting resolved and she was discharged to home from the emergency room. She states that she is doing much better now. She did have some mild episodes of nausea and vomiting last week but not nearly as bad. So far this week she says that she is doing well. She denies any abdominal pain. She states her appetite is still recovering. She denies any diarrhea, constipation fever chills.  Review of Systems     Objective:   Physical Exam BP 128/84  Pulse 92  Temp(Src) 98.5 F (36.9 C) (Oral)  Resp 15  Ht 5\' 8"  (1.727 m)  Wt 229 lb 3.2 oz (103.964 kg)  BMI 34.86 kg/m2  LMP 10/21/2013  Gen: alert, NAD, non-toxic appearing Pupils: equal, no scleral icterus Pulm: Lungs clear to auscultation, symmetric chest rise CV: regular rate and rhythm Abd: soft, nontender, nondistended. Well-healed trocar sites. No cellulitis. No incisional hernia Skin: no rash, no jaundice     Assessment:     Status post laparoscopic cholecystectomy for right upper quadrant pain Elevated LFTs     Plan:     We reviewed her pathology report and she was provided a copy which showed chronic cholecystitis. Not sure what caused her elevated LFTs other than trauma from surgery or  potential dehydration. There is no sign of a large fluid collection in the right upper quadrant suggesting a bile leak. She looks very healthy today and well. However we do need to repeat her labs. We will order a CBC and a comprehensive metabolic panel. Her followup will be based on results of the lab work. If her labs are normal and she has ongoing episodes of intermittent nausea and vomiting which was happening occasionally preoperatively and she will need to followup with her gastroenterologist  Mary SellaEric M. Andrey CampanileWilson, MD, FACS General, Bariatric, & Minimally Invasive Surgery [facility], [place]

## 2013-12-19 ENCOUNTER — Telehealth (INDEPENDENT_AMBULATORY_CARE_PROVIDER_SITE_OTHER): Payer: Self-pay | Admitting: General Surgery

## 2013-12-19 NOTE — Telephone Encounter (Signed)
Patient called back and I went over her labs and told her that her labs was normal

## 2013-12-19 NOTE — Telephone Encounter (Signed)
LMOM for patient to call [facility] and [facility]. Need to go over her labs

## 2014-08-14 ENCOUNTER — Ambulatory Visit (INDEPENDENT_AMBULATORY_CARE_PROVIDER_SITE_OTHER): Payer: 59

## 2014-08-14 ENCOUNTER — Ambulatory Visit: Payer: 59

## 2014-08-14 DIAGNOSIS — Z23 Encounter for immunization: Secondary | ICD-10-CM

## 2014-11-08 ENCOUNTER — Encounter: Payer: Self-pay | Admitting: Emergency Medicine

## 2014-11-08 ENCOUNTER — Emergency Department
Admission: EM | Admit: 2014-11-08 | Discharge: 2014-11-08 | Disposition: A | Discharge status: Home / Self Care | Payer: 59 | Source: Home / Self Care | Attending: Family Medicine | Admitting: Family Medicine

## 2014-11-08 DIAGNOSIS — L0501 Pilonidal cyst with abscess: Secondary | ICD-10-CM

## 2014-11-08 MED ORDER — DOXYCYCLINE HYCLATE 100 MG PO CAPS: 100.0000 mg | ORAL_CAPSULE | Freq: Two times a day (BID) | ORAL | Status: DC

## 2014-11-08 MED ORDER — TRAMADOL HCL 50 MG PO TABS: 50.0000 mg | ORAL_TABLET | Freq: Every evening | ORAL | Status: DC | PRN

## 2014-11-08 NOTE — Discharge Instructions (Signed)
Begin warm Sitz baths/soaks two to three times daily for about 30 minutes.  May continue Aleve, two tabs, every 12 hours.  Obtain a "doughnut" pad for sitting. If symptoms become significantly worse during the night or over the weekend, proceed to the local emergency room.    Pilonidal Cyst A pilonidal cyst occurs when hairs get trapped (ingrown) beneath the skin in the crease between the buttocks over your sacrum (the bone under that crease). Pilonidal cysts are most common in young men with a lot of body hair. When the cyst is ruptured (breaks) or leaking, fluid from the cyst may cause burning and itching. If the cyst becomes infected, it causes a painful swelling filled with pus (abscess). The pus and trapped hairs need to be removed (often by lancing) so that the infection can heal. However, recurrence is common and an operation may be needed to remove the cyst. HOME CARE INSTRUCTIONS   If the cyst was NOT INFECTED:  Keep the area clean and dry. Bathe or shower daily. Wash the area well with a germ-killing soap. Warm tub baths may help prevent infection and help with drainage. Dry the area well with a towel.  Avoid tight clothing to keep area as moisture free as possible.  Keep area between buttocks as free of hair as possible. A depilatory may be used.  If the cyst WAS INFECTED and needed to be drained:  Your caregiver packed the wound with gauze to keep the wound open. This allows the wound to heal from the inside outwards and continue draining.  Return for a wound check in 1 day or as suggested.  If you take tub baths or showers, repack the wound with gauze following them. Sponge baths (at the sink) are a good alternative.  If an antibiotic was ordered to fight the infection, take as directed.  Only take over-the-counter or prescription medicines for pain, discomfort, or fever as directed by your caregiver.  After the drain is removed, use sitz baths for 20 minutes 4 times per day.  Clean the wound gently with mild unscented soap, pat dry, and then apply a dry dressing. SEEK MEDICAL CARE IF:   You have increased pain, swelling, redness, drainage, or bleeding from the area.  You have a fever.  You have muscles aches, dizziness, or a general ill feeling. Document Released: 10/20/2000 Document Revised: 01/15/2012 Document Reviewed: 12/18/2008 [facility] Patient Information 2015 [place], [place]. This information is not intended to replace advice given to you by your health care provider. Make sure you discuss any questions you have with your health care provider.

## 2014-11-08 NOTE — ED Notes (Signed)
Patient reports sensing tenderness developing at base of coccyx/top of gluteal fold over past 4 days; no drainage; has been doing warm soaks; area is painful and has been taking Aleve.

## 2014-11-08 NOTE — ED Provider Notes (Signed)
CSN: [id number]     Arrival date & time 11/08/14  1524 History   First MD Initiated Contact with Patient 11/08/14 1701     Chief Complaint  Patient presents with  . Cyst     HPI Comments: Patient noticed soreness in her lower back four days ago.  The pain gradually increased and localized to her upper coccyx area.  She began warm sitz baths, and yesterday evening noticed redness and increased soreness above her coccyx.  Last night she had chills and low grade fever.  No nausea/vomiting  Patient is a 24 y.o. female presenting with abscess. The history is provided by the patient and a parent.  Abscess Location:  Ano-genital Ano-genital abscess location:  Gluteal cleft Size:  5 cm  Abscess quality: fluctuance, induration, painful, redness and warmth   Abscess quality: not draining and not weeping   Red streaking: no   Duration:  4 days Progression:  Worsening Pain details:    Quality:  Pressure and dull   Severity:  Moderate   Duration:  4 days   Timing:  Constant   Progression:  Worsening Chronicity:  New Relieved by:  Nothing Exacerbated by: sitting. Ineffective treatments:  Warm water soaks Associated symptoms: fever   Associated symptoms: no anorexia, no fatigue, no headaches, no nausea and no vomiting     Past Medical History  Diagnosis Date  . Headache(784.0)     no recent problems   . Chronic cholecystitis without calculus 2014/15    cholecystectomy  . Ovarian cyst, left 12/02/13    Noted on CT abd done for post op n/v and abd pain: ?signs of cyst rupture on CT  . Elevated liver enzymes     AST/ALT/Alk phos: noted 1 wk s/p cholecystectomy.  Needs repeating.   Past Surgical History  Procedure Laterality Date  . No surgical history  Aug 2013  . No past surgeries    . Cholecystectomy N/A 11/25/2013    Procedure: LAPAROSCOPIC CHOLECYSTECTOMY;  Surgeon: Gayland Curry, MD;  Location: [facility];  Service: General;  Laterality: N/A;   Family History  Problem Relation Age of  Onset  . Hypertension Maternal Grandmother   . Migraines Maternal Grandmother    History  Substance Use Topics  . Smoking status: Never Smoker   . Smokeless tobacco: Never Used  . Alcohol Use: No   OB History    No data available     Review of Systems  Constitutional: Positive for fever. Negative for fatigue.  Gastrointestinal: Negative for nausea, vomiting and anorexia.  Neurological: Negative for headaches.  All other systems reviewed and are negative.   Allergies  Review of patient's allergies indicates no known allergies.  Home Medications   Prior to Admission medications   Medication Sig Start Date End Date Taking? Authorizing Provider  doxycycline (VIBRAMYCIN) 100 MG capsule Take 1 capsule (100 mg total) by mouth 2 (two) times daily. Take with food. 11/08/14   Kandra Nicolas, MD  naproxen sodium (ANAPROX) 220 MG tablet Take 220 mg by mouth daily as needed (for pain).    Historical Provider, MD  traMADol (ULTRAM) 50 MG tablet Take 1 tablet (50 mg total) by mouth at bedtime as needed for moderate pain. 11/08/14   Kandra Nicolas, MD   BP 114/76 mmHg  Temp(Src) 99 F (37.2 C) (Oral)  Resp 16  Ht 5' 8"  (1.727 m)  Wt 239 lb (108.41 kg)  BMI 36.35 kg/m2  SpO2 100%  LMP 10/27/2014 Physical Exam  Constitutional: She is oriented to person, place, and time. She appears well-developed and well-nourished. No distress.  Patient is obese (BMI 36.4)  HENT:  Head: Normocephalic.  Eyes: Pupils are equal, round, and reactive to light.  Neck: Neck supple.  Cardiovascular: Normal heart sounds.   Pulmonary/Chest: Breath sounds normal.  Abdominal: There is no tenderness.  Genitourinary:     Superior aspect of gluteal cleft is erythematous and tender to palpation with extension to the right.  The area has about 5cm diameter of induration and fluctuance.  Neurological: She is alert and oriented to person, place, and time.  Skin: Skin is warm and dry.  Nursing note and vitals  reviewed.   ED Course  Procedures       MDM   1. Pilonidal cyst with abscess    Patient declines offer to I and D her cyst. Begin doxycycline 124m BID (#20).  Rx for tramadol at bedtime prn. Begin warm Sitz baths/soaks two to three times daily for about 30 minutes.  May continue Aleve, two tabs, every 12 hours.  Obtain a "doughnut" pad for sitting. If symptoms become significantly worse during the night or over the weekend, proceed to the local emergency room.  Return for I and D if not improved 3 to 4 days.    SKandra Nicolas MD 11/08/14 1816

## 2014-11-09 ENCOUNTER — Telehealth: Payer: Self-pay | Admitting: *Deleted

## 2014-11-11 ENCOUNTER — Telehealth: Payer: Self-pay | Admitting: *Deleted

## 2015-03-16 IMAGING — NM NM HEPATOBILIARY IMAGE, INC GB
2 series · 12 of 12 positions shown · non-contrast
Comparison: None.

RADIOPHARMACEUTICALS:  7.DJ5i Ac-FFm Choletec

CLINICAL DATA: Postprandial abdominal pain in the right upper
quadrant

EXAM:
NUCLEAR MEDICINE HEPATOBILIARY IMAGING WITH GALLBLADDER EF
TECHNIQUE: Sequential images of the abdomen were obtained [DATE] minutes
following intravenous administration of radiopharmaceutical. After
oral ingestion of Ensure, gallbladder ejection fraction was
determined. At 60 min, normal ejection fraction is greater than 33%.

[Series 0: hepatobiliary · 3.20mm/px · 6 of 60 frames shown (1 of 2)]
[frame 6/60]
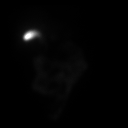
[frame 16/60]
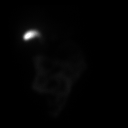
[frame 26/60]
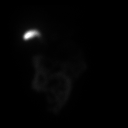
[frame 36/60]
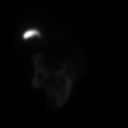
[frame 46/60]
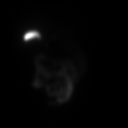
[frame 56/60]
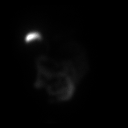

[Series 0: hepatobiliary · 3.20mm/px · 6 of 41 frames shown (2 of 2)]
[frame 4/41]
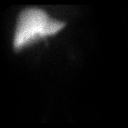
[frame 10/41]
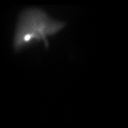
[frame 17/41]
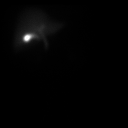
[frame 24/41]
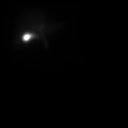
[frame 31/41]
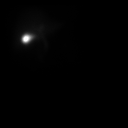
[frame 38/41]
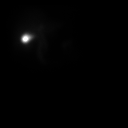

[12 of 12 positions shown; findings below may reference images not displayed]

FINDINGS: There is immediate homogeneous uptake of radiotracer in the liver.
Filling of the gallbladder begins at 10 minutes. Radiotracer uptake
is present in the small bowel at 35 minutes.

When gallbladder filling was complete, the patient was given PO
Ensure. Gallbladder ejection fraction: 26.9%. Normal gallbladder
ejection fraction with Ensure is greater than 33%. The patient did
experience symptoms after oral ingestion of Ensure.
IMPRESSION: 1. No scintigraphic evidence of biliary obstruction.
2. Abnormal decreased gallbladder ejection fraction as can be seen
with biliary dyskinesia.

## 2015-07-30 LAB — HM PAP SMEAR: HM PAP: NEGATIVE

## 2015-08-16 ENCOUNTER — Ambulatory Visit: Payer: 59

## 2015-09-20 ENCOUNTER — Ambulatory Visit (INDEPENDENT_AMBULATORY_CARE_PROVIDER_SITE_OTHER): Payer: 59 | Admitting: Family Medicine

## 2015-09-20 ENCOUNTER — Encounter: Payer: Self-pay | Admitting: Family Medicine

## 2015-09-20 VITALS — BP 120/82 | HR 90 | Temp 98.6°F | Resp 20 | Ht 68.0 in | Wt 249.8 lb

## 2015-09-20 DIAGNOSIS — Z23 Encounter for immunization: Secondary | ICD-10-CM | POA: Diagnosis not present

## 2015-09-20 DIAGNOSIS — M542 Cervicalgia: Secondary | ICD-10-CM | POA: Diagnosis not present

## 2015-09-20 MED ORDER — CYCLOBENZAPRINE HCL 10 MG PO TABS: 10.0000 mg | ORAL_TABLET | Freq: Three times a day (TID) | ORAL | Status: DC | PRN

## 2015-09-20 MED ORDER — TRAMADOL HCL 50 MG PO TABS: 50.0000 mg | ORAL_TABLET | Freq: Two times a day (BID) | ORAL | Status: DC | PRN

## 2015-09-20 MED ORDER — PREDNISONE 50 MG PO TABS: ORAL_TABLET | ORAL | Status: DC

## 2015-09-20 NOTE — Patient Instructions (Signed)
Muscle Cramps and Spasms Muscle cramps and spasms occur when a muscle or muscles tighten and you have no control over this tightening (involuntary muscle contraction). They are a common problem and can develop in any muscle. The most common place is in the calf muscles of the leg. Both muscle cramps and muscle spasms are involuntary muscle contractions, but they also have differences:   Muscle cramps are sporadic and painful. They may last a few seconds to a quarter of an hour. Muscle cramps are often more forceful and last longer than muscle spasms.  Muscle spasms may or may not be painful. They may also last just a few seconds or much longer. CAUSES  It is uncommon for cramps or spasms to be due to a serious underlying problem. In many cases, the cause of cramps or spasms is unknown. Some common causes are:   Overexertion.   Overuse from repetitive motions (doing the same thing over and over).   Remaining in a certain position for a long period of time.   Improper preparation, form, or technique while performing a sport or activity.   Dehydration.   Injury.   Side effects of some medicines.   Abnormally low levels of the salts and ions in your blood (electrolytes), especially potassium and calcium. This could happen if you are taking water pills (diuretics) or you are pregnant.  Some underlying medical problems can make it more likely to develop cramps or spasms. These include, but are not limited to:   Diabetes.   Parkinson disease.   Hormone disorders, such as thyroid problems.   Alcohol abuse.   Diseases specific to muscles, joints, and bones.   Blood vessel disease where not enough blood is getting to the muscles.  HOME CARE INSTRUCTIONS   Stay well hydrated. Drink enough water and fluids to keep your urine clear or pale yellow.  It may be helpful to massage, stretch, and relax the affected muscle.  For tight or tense muscles, use a warm towel, heating  pad, or hot shower water directed to the affected area.  If you are sore or have pain after a cramp or spasm, applying ice to the affected area may relieve discomfort.  Put ice in a plastic bag.  Place a towel between your skin and the bag.  Leave the ice on for 15-20 minutes, 03-04 times a day.  Medicines used to treat a known cause of cramps or spasms may help reduce their frequency or severity. Only take over-the-counter or prescription medicines as directed by your caregiver. SEEK MEDICAL CARE IF:  Your cramps or spasms get more severe, more frequent, or do not improve over time.  MAKE SURE YOU:   Understand these instructions.  Will watch your condition.  Will get help right away if you are not doing well or get worse.   This information is not intended to replace advice given to you by your health care provider. Make sure you discuss any questions you have with your health care provider.   Document Released: 04/14/2002 Document Revised: 02/17/2013 Document Reviewed: 10/09/2012 Elsevier Interactive Patient Education [employer]2016 Elsevier Inc.  Get xray within the next few days.  Take tramadol for severe pain only. Prednisone for 5 days to take out inflammation, then can use aleve two times a day if needed. Heat therapy.  If not improvement f/u 2-4 weeks.

## 2015-09-20 NOTE — Progress Notes (Signed)
 Subjective:    Patient ID: Amber Mitchell, female    DOB: 11/16/1990, 24 y.o.   MRN: [id number]  HPI  Thoracic/neck spasms: Patient presents for acute office visit secondary to upper back and neck spasms. She states she's had this problem ever since she was in ninth grade. She states at that time she used to play volleyball. She sustained an injury in ninth grade when throwing a lunch box she heard a pop in her neck. She states ever since then she's had difficulties intermittently. She had imaging studies completed in 2011 which were normal. She was treated for muscle spasm, and told she likely had a pinched nerve at that time. She was prescribed physical therapy, which she admits she only went twice. She also had a steroid burst at that time. She states it's hurt intermittently since then, maybe 2-3 times a year. She reports over the last few months she has noticed is starting to bother her a few times a month. She states is normally the right side of her neck that is affected, but today it is her left side. She reports tingling sensations on occasions in her right fifth finger and forearm. She states there are times where she feels like she has to hold things tighter with her right hand in fear that she will drop it. 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 She has been using Aspercreme, hot and cold muscle sprays/pads, heating pads and Aleve.  Past Medical History  Diagnosis Date  . Headache(784.0)     no recent problems   . Chronic cholecystitis without calculus 2014/15    cholecystectomy  . Ovarian cyst, left 12/02/13    Noted on CT abd done for post op n/v and abd pain: ?signs of cyst rupture on CT  . Elevated liver enzymes     AST/ALT/Alk phos: noted 1 wk s/p cholecystectomy.  Needs repeating.  . Neck pain    No Known Allergies Past Surgical History  Procedure Laterality Date  . No surgical history  Aug 2013  . No past surgeries    .  Cholecystectomy N/A 11/25/2013    Procedure: LAPAROSCOPIC CHOLECYSTECTOMY;  Surgeon: Eric M Wilson, MD;  Location: [facility];  Service: General;  Laterality: N/A;    Review of Systems Negative, with the exception of above mentioned in HPI     Objective:   Physical Exam BP 120/82 mmHg  Pulse 90  Temp(Src) 98.6 F (37 C) (Oral)  Resp 20  Ht 5' 8" (1.727 m)  Wt 249 lb 12 oz (113.286 kg)  BMI 37.98 kg/m2  SpO2 97%  LMP 09/13/2015 (Approximate) Gen: Afebrile. No acute distress. Nontoxic in appearance, well-developed, well-nourished, Caucasian female, obese. Very pleasant. HENT: AT. [place].  MMM.  Eyes:Pupils Equal Round Reactive to light, Extraocular movements intact,  Conjunctiva without redness, discharge or icterus. Neck/lymp/endocrine: Supple,no lymphadenopathy Neuro/msk: Normal gait. PERLA. EOMi. Alert. Oriented x3.  Cranial nerves II through XII intact. No erythema, no bony tenderness cervical or thoracic spine. Tissue texture changes/spasm right trapezium with tenderness to palpation. Mildly decreased neck flexion and extension secondary to discomfort. Discomfort with right rotation and left side bending of cervical spine. Full range of motion bilateral arms. Muscle strength 5/5 bilateral upper extremity. Negative Hawkins, negative empty can test, negative O'Brien's, negative liftoff test. Negative Tinel's at wrist and elbow right arm. Neurovascular intact distally.     Assessment & Plan:  1. Neck pain on right side -   Muscle relaxer prescribed, at least to use before bedtime. Caution on sedation if used during the day. Heating pad application and topical Aspercreme etc. can be continued. Prednisone burst. Then patient can use Aleve 1-2 times a day as needed. Tramadol prescribed for severe pain. Patient aware this is a short-term medication and should not be used without severe pain. Cervical films ordered. - DG Cervical Spine Complete; Future - cyclobenzaprine (FLEXERIL) 10 MG tablet; Take 1  tablet (10 mg total) by mouth 3 (three) times daily as needed for muscle spasms.  Dispense: 30 tablet; Refill: 0 - predniSONE (DELTASONE) 50 MG tablet; Take 50 mg daily for 5 days  Dispense: 5 tablet; Refill: 0 - traMADol (ULTRAM) 50 MG tablet; Take 1 tablet (50 mg total) by mouth every 12 (twelve) hours as needed.  Dispense: 30 tablet; Refill: 0 - Patient follow-up in 2-4 weeks if no improvement on symptoms.  2. Need for prophylactic vaccination and inoculation against influenza - Flu Vaccine QUAD 36+ mos PF IM (Fluarix & Fluzone Quad PF)  

## 2015-09-29 ENCOUNTER — Ambulatory Visit (INDEPENDENT_AMBULATORY_CARE_PROVIDER_SITE_OTHER)
Admission: RE | Admit: 2015-09-29 | Discharge: 2015-09-29 | Disposition: A | Discharge status: Home / Self Care | Payer: 59 | Source: Ambulatory Visit | Attending: Family Medicine | Admitting: Family Medicine

## 2015-09-29 ENCOUNTER — Telehealth: Payer: Self-pay | Admitting: Family Medicine

## 2015-09-29 DIAGNOSIS — M542 Cervicalgia: Secondary | ICD-10-CM | POA: Diagnosis not present

## 2015-09-29 NOTE — Telephone Encounter (Signed)
Please call pt: She was seen November 14 for neck pain, she has gone to have her next x-rays completed today. Review of x-ray show some very mild degenerative changes, that potentially could be the cause of her occasional flareups of neck pain. If patient is still having discomfort without improvement after medications given, she should be encouraged to follow-up for further discussion/evaluation.

## 2015-09-29 NOTE — Telephone Encounter (Signed)
Left message with results and instructions on patient voice mail. 

## 2015-10-22 ENCOUNTER — Ambulatory Visit (INDEPENDENT_AMBULATORY_CARE_PROVIDER_SITE_OTHER): Payer: 59 | Admitting: Family Medicine

## 2015-10-22 ENCOUNTER — Encounter: Payer: Self-pay | Admitting: Family Medicine

## 2015-10-22 DIAGNOSIS — M542 Cervicalgia: Secondary | ICD-10-CM | POA: Diagnosis not present

## 2015-10-22 MED ORDER — GABAPENTIN 100 MG PO CAPS: 100.0000 mg | ORAL_CAPSULE | Freq: Three times a day (TID) | ORAL | Status: DC

## 2015-10-22 NOTE — Patient Instructions (Signed)
I have called in medicine called gabapentin. I would like you to start taking 100 mg at night. If you are able to tolerate without sedation, increase to 100 mg three times a day. If in 1 week you are still have discomfort, increase night dose to 300 mg, and keep day dose to 100 mg each time. You can taper up to 300 mg three times a day slowly depending on if you are still have discomfort and if you are tolerating medicine well.  I have ordered PT for you to start as well.  I will need to see you in 4 weeks to see how you are doing on medicine and PT.

## 2015-10-22 NOTE — Progress Notes (Signed)
Subjective:    Patient ID: Amber Mitchell, female    DOB: [birth year], 24 y.o.   MRN: [id number]  HPI  Neck pain: Patient presents to office visit today for scheduled follow-up to her neck pain. Patient was treated with a steroid burst, NSAIDs, heat therapy. Patient states that most days she does well, however she does have days that it is "irritated" all day. Some days it is more irritated with movement. She states on those states she has to have her mother brush her hair because it hurts to reach behind her head to brush her hair. Area of pain points to the trapezius and right sternocleidomastoid area. Patient denies any weakness, numbness or tingling in her arms or hands. Past Medical History  Diagnosis Date  . Headache(784.0)     no recent problems   . Chronic cholecystitis without calculus 2014/15    cholecystectomy  . Ovarian cyst, left 12/02/13    Noted on CT abd done for post op n/v and abd pain: ?signs of cyst rupture on CT  . Elevated liver enzymes     AST/ALT/Alk phos: noted 1 wk s/p cholecystectomy.  Needs repeating.  . Neck pain    No Known Allergies Social History   Social History  . Marital Status: Single    Spouse Name: N/A  . Number of Children: N/A  . Years of Education: N/A   Occupational History  . Not on file.   Social History Main Topics  . Smoking status: Never Smoker   . Smokeless tobacco: Never Used  . Alcohol Use: No  . Drug Use: No  . Sexual Activity: No   Other Topics Concern  . Not on file   Social History Narrative    Past Medical History  Diagnosis Date  . Headache(784.0)     no recent problems   . Chronic cholecystitis without calculus 2014/15    cholecystectomy  . Ovarian cyst, left 12/02/13    Noted on CT abd done for post op n/v and abd pain: ?signs of cyst rupture on CT  . Elevated liver enzymes     AST/ALT/Alk phos: noted 1 wk s/p cholecystectomy.  Needs repeating.  . Neck pain    No Known Allergies   Review of  Systems Negative, with the exception of above mentioned in HPI     Objective:   Physical Exam BP 115/82 mmHg  Pulse 88  Temp(Src) 98.7 F (37.1 C) (Oral)  Resp 20  Wt 246 lb 12 oz (111.925 kg)  SpO2 97%  LMP 10/18/2015  Body mass index is 37.53 kg/(m^2). Gen: Afebrile. No acute distress. Nontoxic in appearance, well-developed, well-nourished, obese Caucasian female. Very pleasant. HENT: AT. [place]. MMM.  Eyes:Pupils Equal Round Reactive to light, Extraocular movements intact,  Conjunctiva without redness, discharge or icterus. Neuro/msk: Normal gait. PERLA. EOMi. Alert. Oriented x3. Cranial nerves II through XII intact. No erythema, no bony tenderness cervical or thoracic spine. Very mild Tissue texture spasm right trapezium, no tenderness to palpation. Flexion normal, mild discomfort with next extension however improved from last exam. No discomfort with rotation of cervical spine. Mild discomfort with right side bending.  Full range of motion bilateral arms, with mild tightness with full extension of right arm.. Muscle strength 5/5 bilateral upper extremity. Negative Hawkins, negative empty can test, negative O'Brien's, negative liftoff test. Negative Tinel's at wrist and elbow right arm. Neurovascular intact distally     Assessment & Plan:  1. Neck pain on right side - Patient  continues to have increased frequency and discomfort. She does admit to improvement after steroid course and treatment after last office visit. Considering her symptoms still seem to be progressing from prior, will do a trial of gabapentin. Patient was counseled on tapering with AVS detailed information on tapering gabapentin. - She is to continue the muscle relaxer and heat therapy as needed. She has yet to even use the tramadol, I encouraged her to use this for severe pain if needed. - Patient had no notable weaknesses on exam today. We reviewed her x-ray results and images together. She was told to ask any  questions, all questions were answered. - Patient agreed to trial of physical therapy. This order has been placed for her today. If patient does not improve by follow-up in 4 weeks, will consider sending to spine specialist. - gabapentin (NEURONTIN) 100 MG capsule; Take 1-3 capsules (100-300 mg total) by mouth 3 (three) times daily.  Dispense: 120 capsule; Refill: 2 - Ambulatory referral to Physical Therapy  Follow-up 4 weeks

## 2015-11-04 ENCOUNTER — Ambulatory Visit: Payer: 59 | Admitting: Family Medicine

## 2015-11-19 ENCOUNTER — Ambulatory Visit: Payer: 59 | Admitting: Family Medicine

## 2016-02-01 ENCOUNTER — Ambulatory Visit (INDEPENDENT_AMBULATORY_CARE_PROVIDER_SITE_OTHER): Payer: 59 | Admitting: Physician Assistant

## 2016-02-01 VITALS — BP 114/80 | HR 120 | Temp 100.9°F | Resp 20 | Ht 69.0 in | Wt 250.6 lb

## 2016-02-01 DIAGNOSIS — J111 Influenza due to unidentified influenza virus with other respiratory manifestations: Secondary | ICD-10-CM

## 2016-02-01 DIAGNOSIS — R05 Cough: Secondary | ICD-10-CM

## 2016-02-01 DIAGNOSIS — R69 Illness, unspecified: Principal | ICD-10-CM

## 2016-02-01 DIAGNOSIS — R059 Cough, unspecified: Secondary | ICD-10-CM

## 2016-02-01 MED ORDER — OSELTAMIVIR PHOSPHATE 75 MG PO CAPS: 75.0000 mg | ORAL_CAPSULE | Freq: Two times a day (BID) | ORAL | Status: DC

## 2016-02-01 MED ORDER — HYDROCODONE-HOMATROPINE 5-1.5 MG/5ML PO SYRP: 2.5000 mL | ORAL_SOLUTION | Freq: Every evening | ORAL | Status: DC | PRN

## 2016-02-01 MED FILL — OSELTAMIVIR PHOS 75 MG CAP: 75 | 5 days supply | Qty: 10 | Fill #0

## 2016-02-01 NOTE — Progress Notes (Signed)
02/01/2016 5:46 PM   DOB: [birth year] / MRN: [id number]  SUBJECTIVE:  Amber LeachKelly E Mitchell is a 25 y.o. female presenting for cough that has been present for roughly 1 week.  Reports last night that she began to have fever, myalgia, severe nasal congestion and HA.  These latter symptoms had an abrupt onset.    She has No Known Allergies.   She  has a past medical history of Headache(784.0); Chronic cholecystitis without calculus (2014/15); Ovarian cyst, left (12/02/13); Elevated liver enzymes; and Neck pain.    She  reports that she has never smoked. She has never used smokeless tobacco. She reports that she does not drink alcohol or use illicit drugs. She  reports that she does not engage in sexual activity. The patient  has past surgical history that includes No surgical history (Aug 2013); No past surgeries; and Cholecystectomy (N/A, 11/25/2013).  Her family history includes Hypertension in her maternal grandmother; Migraines in her maternal grandmother.  Review of Systems  Constitutional: Negative for fever and chills.  HENT: Positive for congestion.   Respiratory: Positive for cough. Negative for sputum production, shortness of breath and wheezing.   Cardiovascular: Negative for chest pain.  Gastrointestinal: Negative for nausea.  Musculoskeletal: Positive for myalgias.  Skin: Negative for rash.  Neurological: Positive for headaches. Negative for dizziness.    Problem list and medications reviewed and updated by myself where necessary, and exist elsewhere in the encounter.   OBJECTIVE:  BP 114/80 mmHg  Pulse 120  Temp(Src) 100.9 F (38.3 C) (Oral)  Resp 20  Ht 5\' 9"  (1.753 m)  Wt 250 lb 9.6 oz (113.671 kg)  BMI 36.99 kg/m2  SpO2 98%  LMP 01/24/2016  Physical Exam  Constitutional: She is oriented to person, place, and time.  HENT:  Right Ear: External ear normal.  Left Ear: External ear normal.  Nose: Mucosal edema present. Right sinus exhibits no maxillary sinus tenderness  and no frontal sinus tenderness. Left sinus exhibits no maxillary sinus tenderness and no frontal sinus tenderness.  Mouth/Throat: Oropharynx is clear and moist. No oropharyngeal exudate.  Eyes: Conjunctivae are normal. Pupils are equal, round, and reactive to light.  Cardiovascular: Regular rhythm and normal heart sounds.   Pulmonary/Chest: Effort normal and breath sounds normal. She has no rales.  Neurological: She is alert and oriented to person, place, and time.  Skin: Skin is warm and dry. No rash noted. She is not diaphoretic. No erythema.  Psychiatric: Her behavior is normal.    No results found for this or any previous visit (from the past 72 hour(s)).  No results found.  ASSESSMENT AND PLAN  Tresa EndoKelly was seen today for cough and fever.  Diagnoses and all orders for this visit:  Influenza-like illness: Febrile female here today with symptoms consistent with the flu.  She is taking OCPs and has not missed doses.  Will treat her for the flu.  School note provided.   -     oseltamivir (TAMIFLU) 75 MG capsule; Take 1 capsule (75 mg total) by mouth 2 (two) times daily. -     HYDROcodone-homatropine (HYCODAN) 5-1.5 MG/5ML syrup; Take 2.5-5 mLs by mouth at bedtime as needed.  Cough: See problem one.    The patient was advised to call or return to clinic if she does not see an improvement in symptoms or to seek the care of the closest emergency department if she worsens with the above plan.   Deliah BostonMichael Clark, MHS, PA-C [facility] 02/01/2016 5:46 PM

## 2016-02-01 NOTE — Patient Instructions (Signed)
     IF you received an x-ray today, you will receive an invoice from [facility]. Please contact  Radiology at [phone] with questions or concerns regarding your invoice.   IF you received labwork today, you will receive an invoice from [facility]. Please contact [facility] at [phone] with questions or concerns regarding your invoice.   Our billing staff will not be able to assist you with questions regarding bills from these companies.  You will be contacted with the lab results as soon as they are available. The fastest way to get your results is to activate your My Chart account. Instructions are located on the last page of this paperwork. If you have not heard from us regarding the results in 2 weeks, please contact this office.      

## 2016-02-02 MED FILL — HYDROCODONE-HOMATROPINE SYR: 5-1.5 | 12 days supply | Qty: 60 | Fill #0

## 2016-03-03 MED FILL — EMOQUETTE 28 DAY TABLET: 0.15-30 | 84 days supply | Qty: 84 | Fill #1

## 2016-06-20 MED FILL — EMOQUETTE 28 DAY TABLET: 0.15-30 | 84 days supply | Qty: 84 | Fill #2

## 2016-08-28 DIAGNOSIS — Z01419 Encounter for gynecological examination (general) (routine) without abnormal findings: Secondary | ICD-10-CM | POA: Diagnosis not present

## 2016-08-28 DIAGNOSIS — Z793 Long term (current) use of hormonal contraceptives: Secondary | ICD-10-CM | POA: Diagnosis not present

## 2016-08-28 DIAGNOSIS — Z130 Encounter for screening for diseases of the blood and blood-forming organs and certain disorders involving the immune mechanism: Secondary | ICD-10-CM | POA: Diagnosis not present

## 2016-08-28 DIAGNOSIS — Z6830 Body mass index (BMI) 30.0-30.9, adult: Secondary | ICD-10-CM | POA: Diagnosis not present

## 2016-08-28 DIAGNOSIS — Z3041 Encounter for surveillance of contraceptive pills: Secondary | ICD-10-CM | POA: Diagnosis not present

## 2016-08-28 DIAGNOSIS — Z1389 Encounter for screening for other disorder: Secondary | ICD-10-CM | POA: Diagnosis not present

## 2016-08-28 MED FILL — JULEBER 0.15-30 MG-MCG TABS: 0.15-30 | 84 days supply | Qty: 84 | Fill #0

## 2016-12-07 ENCOUNTER — Encounter: Payer: Self-pay | Admitting: Family Medicine

## 2016-12-07 ENCOUNTER — Ambulatory Visit (INDEPENDENT_AMBULATORY_CARE_PROVIDER_SITE_OTHER): Payer: 59 | Admitting: Family Medicine

## 2016-12-07 VITALS — BP 118/80 | HR 103 | Temp 98.5°F | Ht 69.0 in | Wt 231.4 lb

## 2016-12-07 DIAGNOSIS — R112 Nausea with vomiting, unspecified: Secondary | ICD-10-CM | POA: Diagnosis not present

## 2016-12-07 DIAGNOSIS — R1013 Epigastric pain: Secondary | ICD-10-CM

## 2016-12-07 DIAGNOSIS — R197 Diarrhea, unspecified: Secondary | ICD-10-CM

## 2016-12-07 DIAGNOSIS — Z23 Encounter for immunization: Secondary | ICD-10-CM | POA: Diagnosis not present

## 2016-12-07 DIAGNOSIS — Z3200 Encounter for pregnancy test, result unknown: Secondary | ICD-10-CM | POA: Diagnosis not present

## 2016-12-07 LAB — BASIC METABOLIC PANEL
BUN: 13 mg/dL (ref 6–23)
CO2: 25 meq/L (ref 19–32)
Calcium: 9.2 mg/dL (ref 8.4–10.5)
Chloride: 105 mEq/L (ref 96–112)
Creatinine, Ser: 0.73 mg/dL (ref 0.40–1.20)
GFR: 104.07 mL/min (ref >60.00)
GLUCOSE: 82 mg/dL (ref 70–99)
POTASSIUM: 4.1 meq/L (ref 3.5–5.1)
Sodium: 138 mEq/L (ref 135–145)

## 2016-12-07 LAB — CBC WITH DIFFERENTIAL/PLATELET
BASOS ABS: 0 10*3/uL (ref 0.0–0.1)
Basophils Relative: 0.3 % (ref 0.0–3.0)
Eosinophils Absolute: 0.1 10*3/uL (ref 0.0–0.7)
Eosinophils Relative: 1.3 % (ref 0.0–5.0)
HEMATOCRIT: 40.5 % (ref 36.0–46.0)
Hemoglobin: 13.3 g/dL (ref 12.0–15.0)
LYMPHS ABS: 2.2 10*3/uL (ref 0.7–4.0)
Lymphocytes Relative: 20.8 % (ref 12.0–46.0)
MCHC: 32.7 g/dL (ref 30.0–36.0)
MCV: 80.1 fl (ref 78.0–100.0)
Monocytes Absolute: 0.6 10*3/uL (ref 0.1–1.0)
Monocytes Relative: 5.9 % (ref 3.0–12.0)
Neutro Abs: 7.6 10*3/uL (ref 1.4–7.7)
Neutrophils Relative %: 71.7 % (ref 43.0–77.0)
Platelets: 380 10*3/uL (ref 150.0–400.0)
RBC: 5.06 Mil/uL (ref 3.87–5.11)
RDW: 13.9 % (ref 11.5–15.5)
WBC: 10.6 10*3/uL — ABNORMAL HIGH (ref 4.0–10.5)

## 2016-12-07 LAB — HEPATIC FUNCTION PANEL
ALBUMIN: 4.2 g/dL (ref 3.5–5.2)
ALT: 60 U/L — ABNORMAL HIGH (ref 0–35)
AST: 19 U/L (ref 0–37)
Alkaline Phosphatase: 77 U/L (ref 39–117)
Bilirubin, Direct: 0.1 mg/dL (ref 0.0–0.3)
Total Bilirubin: 0.3 mg/dL (ref 0.2–1.2)
Total Protein: 7.3 g/dL (ref 6.0–8.3)

## 2016-12-07 LAB — LIPASE: Lipase: 20 U/L (ref 11.0–59.0)

## 2016-12-07 LAB — POCT URINE PREGNANCY: Preg Test, Ur: NEGATIVE

## 2016-12-07 MED ORDER — ONDANSETRON HCL 4 MG PO TABS: 4.0000 mg | ORAL_TABLET | Freq: Three times a day (TID) | ORAL | 0 refills | Status: DC | PRN

## 2016-12-07 NOTE — Progress Notes (Signed)
Pre visit review using our clinic review tool, if applicable. No additional management support is needed unless otherwise documented below in the visit note. 

## 2016-12-07 NOTE — Patient Instructions (Signed)
BEFORE YOU LEAVE: -follow up: new patient visit in 2 weeks -labs  [place] of fluids with electrolytes - soup broth is good.  Prilosec daily for 1 week.  Imodium if needed for diarrhea.  Zofran if needed for nausea and vomiting.  We have ordered labs or studies at this visit. It can take up to 1-2 weeks for results and processing. IF results require follow up or explanation, we will call you with instructions. Clinically stable results will be released to your [facility]. If you have not heard from us or cannot find your results in [facility] in 2 weeks please contact our office at [phone].  If you are not yet signed up for [facility], please consider signing up.  Seek care immediately if worsening, severe or persistent abdominal pain, unable to tolerate oral intake of fluids or other concerns.

## 2016-12-07 NOTE — Addendum Note (Signed)
Addended by: Charna ElizabethLEMMONS, Lorynn Moeser L on: 12/07/2016 02:38 PM   Modules accepted: Orders

## 2016-12-07 NOTE — Addendum Note (Signed)
Addended by: Charna ElizabethLEMMONS, Estrellita Lasky L on: 12/07/2016 02:37 PM   Modules accepted: Orders

## 2016-12-07 NOTE — Addendum Note (Signed)
Addended by: Johnella MoloneyFUNDERBURK, JO A on: 12/07/2016 02:33 PM   Modules accepted: Orders

## 2016-12-07 NOTE — Progress Notes (Signed)
HPI:  Amber Mitchell is a pleasant 26 yo with PMH significant for cholecystectomy remotely here for an acute visit for nausea, vomiting and diarrhea. For the the last 2 weeks she has had intermittent episodes of nausea, vomiting and diarrhea. She can feel perfectly fine between episodes. She has had 3 episodes, each lasting a few hours. Some epigastric abdominal pain with episodes. Denies fevers, wt loss, persistent symptom, melena, hematochezia, anorexia, pregnancy, dysuria, alcohol, increase stress, recent abx or travel or drug use.FDLMP 11/02/26. Denies any chance of pregnancy.  ROS: See pertinent positives and negatives per HP  Past Medical History:  Diagnosis Date  . Chronic cholecystitis without calculus 2014/15   cholecystectomy  . Elevated liver enzymes    AST/ALT/Alk phos: noted 1 wk s/p cholecystectomy.  Needs repeating.  Marland Kitchen Headache(784.0)    no recent problems   . Neck pain   . Ovarian cyst, left 12/02/13   Noted on CT abd done for post op n/v and abd pain: ?signs of cyst rupture on CT    Past Surgical History:  Procedure Laterality Date  . CHOLECYSTECTOMY N/A 11/25/2013   Procedure: LAPAROSCOPIC CHOLECYSTECTOMY;  Surgeon: Gayland Curry, MD;  Location: [facility];  Service: General;  Laterality: N/A;  . NO PAST SURGERIES    . No surgical history  Aug 2013    Family History  Problem Relation Age of Onset  . Hypertension Maternal Grandmother   . Migraines Maternal Grandmother     Social History   Social History  . Marital status: Single    Spouse name: N/A  . Number of children: N/A  . Years of education: N/A   Social History Main Topics  . Smoking status: Never Smoker  . Smokeless tobacco: Never Used  . Alcohol use No  . Drug use: No  . Sexual activity: No   Other Topics Concern  . None   Social History Narrative  . None     Current Outpatient Prescriptions:  .  Cyclobenzaprine HCl (FLEXERIL PO), Take by mouth., Disp: , Rfl:  .  GABAPENTIN PO, Take by  mouth., Disp: , Rfl:  .  JULEBER 0.15-30 MG-MCG tablet, , Disp: , Rfl: 5 .  ondansetron (ZOFRAN) 4 MG tablet, Take 1 tablet (4 mg total) by mouth every 8 (eight) hours as needed for nausea or vomiting., Disp: 20 tablet, Rfl: 0  EXAM:  Vitals:   12/07/16 1343  BP: 118/80  Pulse: (!) 103  Temp: 98.5 F (36.9 C)    Body mass index is 34.17 kg/m.  GENERAL: vitals reviewed and listed above, alert, oriented, appears well hydrated and in no acute distress  HEENT: atraumatic, conjunttiva clear, no obvious abnormalities on inspection of external nose and ears  NECK: no obvious masses on inspection  LUNGS: clear to auscultation bilaterally, no wheezes, rales or rhonchi, good air movement  CV: HRRR, no peripheral edema  ABD: BS+, soft, minimal epigastric TTP, no rebound or guarding  MS: moves all extremities without noticeable abnormality  PSYCH: pleasant and cooperative, no obvious depression or anxiety  ASSESSMENT AND PLAN:  Discussed the following assessment and plan:  Nausea and vomiting, intractability of vomiting not specified, unspecified vomiting type - Plan: CMP with eGFR, CBC with Differential/Platelets, Lipase, H. pylori antigen, stool  Diarrhea, unspecified type  Epigastric abdominal pain  -we discussed possible serious and likely etiologies, workup and treatment, treatment risks and return precautions -benign exam -will get upreg and labs per orders, CT scan if recurs or worsening and/or  pending results -zofran, imodium, low dose PPI while awaiting results -Patient advised to return or notify a doctor immediately if symptoms worsen or persist or new concerns arise. -f/u with PCP in 2 weeks - pt and mother request transfer of care to this office as location is much more convenient for them (ok with me if they choose to do so)  Patient Instructions  BEFORE YOU LEAVE: -follow up: new patient visit in 2 weeks -labs  [place] of fluids with electrolytes - soup broth  is good.  Prilosec daily for 1 week.  Imodium if needed for diarrhea.  Zofran if needed for nausea and vomiting.  We have ordered labs or studies at this visit. It can take up to 1-2 weeks for results and processing. IF results require follow up or explanation, we will call you with instructions. Clinically stable results will be released to your [facility]. If you have not heard from [place] or cannot find your results in [facility] in 2 weeks please contact our office at [phone].  If you are not yet signed up for [facility], please consider signing up.  Seek care immediately if worsening, severe or persistent abdominal pain, unable to tolerate oral intake of fluids or other concerns.        Colin Benton R., DO

## 2016-12-08 ENCOUNTER — Telehealth: Payer: Self-pay | Admitting: Family Medicine

## 2016-12-08 NOTE — Telephone Encounter (Signed)
Not likely related to current symptoms.

## 2016-12-08 NOTE — Telephone Encounter (Signed)
° ° °  Pt call to say she saw Dr Selena BattenKim 12/07/16 and while reading her mychart she saw that it said she had a ovarian cyst and called today to ask if what she saw Dr Selena BattenKim for had anything to do the cyst. Would like a call back ..Marland Kitchen

## 2016-12-11 LAB — H. PYLORI ANTIGEN, STOOL: H pylori Ag, Stl: NEGATIVE

## 2016-12-11 NOTE — Telephone Encounter (Signed)
Patient called back and I informed her of the message below. 

## 2016-12-11 NOTE — Telephone Encounter (Signed)
I left a message for the pt to return my call. 

## 2016-12-22 ENCOUNTER — Encounter: Payer: Self-pay | Admitting: Family Medicine

## 2016-12-22 ENCOUNTER — Ambulatory Visit (INDEPENDENT_AMBULATORY_CARE_PROVIDER_SITE_OTHER): Payer: 59 | Admitting: Family Medicine

## 2016-12-22 VITALS — BP 100/82 | HR 84 | Temp 98.4°F | Ht 69.0 in | Wt 229.8 lb

## 2016-12-22 DIAGNOSIS — Z6833 Body mass index (BMI) 33.0-33.9, adult: Secondary | ICD-10-CM | POA: Diagnosis not present

## 2016-12-22 DIAGNOSIS — Z7689 Persons encountering health services in other specified circumstances: Secondary | ICD-10-CM | POA: Diagnosis not present

## 2016-12-22 DIAGNOSIS — M542 Cervicalgia: Secondary | ICD-10-CM | POA: Diagnosis not present

## 2016-12-22 DIAGNOSIS — F411 Generalized anxiety disorder: Secondary | ICD-10-CM | POA: Diagnosis not present

## 2016-12-22 NOTE — Progress Notes (Signed)
HPI:  Amber Mitchell is here to establish care. Reports saw her gynecologist last year with a pap done then. All of her GI issues form recent visit have completely resolved.  Has the following chronic problems that require follow up and concerns today:  DDD/Chronic neck pain: -diagnosed in 2016 -working on exercise and postural exercise -takes flexeril and gabapentin about once per months when has bad pain -gabapentin rare use and makes her very tired -uses half does flexeril rarely -has flares every 1-3 months  Irr menses/ovarian cyst: -on Juleber for this -UTD on pap -sees gyn   GAD: -chronic -no depression or panic -generalized worry and stress embarrassed to admit to this -no SI, thoughts of self harm, severe symptoms   ROS negative for unless reported above: fevers, unintentional weight loss, hearing or vision loss, chest pain, palpitations, struggling to breath, hemoptysis, melena, hematochezia, hematuria, falls, loc, si, thoughts of self harm  Past Medical History:  Diagnosis Date  . Anxiety 06/04/2012  . Chronic cholecystitis without calculus 2014/15   cholecystectomy  . Elevated liver enzymes    AST/ALT/Alk phos: noted 1 wk s/p cholecystectomy.  Needs repeating.  Marland Kitchen Headache(784.0)    no recent problems   . Neck pain   . Ovarian cyst, left 12/02/13   Noted on CT abd done for post op n/v and abd pain: ?signs of cyst rupture on CT    Past Surgical History:  Procedure Laterality Date  . CHOLECYSTECTOMY N/A 11/25/2013   Procedure: LAPAROSCOPIC CHOLECYSTECTOMY;  Surgeon: Gayland Curry, MD;  Location: [facility];  Service: General;  Laterality: N/A;    Family History  Problem Relation Age of Onset  . Hypertension Maternal Grandmother   . Migraines Maternal Grandmother     Social History   Social History  . Marital status: Single    Spouse name: N/A  . Number of children: N/A  . Years of education: N/A   Social History Main Topics  . Smoking status:  Never Smoker  . Smokeless tobacco: Never Used  . Alcohol use No  . Drug use: No  . Sexual activity: No   Other Topics Concern  . 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  JULEBER 0.15-30 MG-MCG tablet, , Disp: , Rfl: 5  EXAM:  Vitals:   12/22/16 1316  BP: 100/82  Pulse: 84  Temp: 98.4 F (36.9 C)    Body mass index is 33.94 kg/m.  GENERAL: vitals reviewed and listed above, alert, oriented, appears well hydrated and in no acute distress  HEENT: atraumatic, conjunttiva clear, no obvious abnormalities on inspection of external nose and ears  NECK: no obvious masses on inspection  LUNGS: clear to auscultation bilaterally, no wheezes, rales or rhonchi, good air movement  CV: HRRR, no peripheral edema  MS: moves all extremities without noticeable abnormality  PSYCH: pleasant and cooperative, no obvious depression or anxiety  ASSESSMENT AND PLAN:  Discussed the following assessment and plan:  Encounter to establish care -We reviewed the PMH, PSH, FH, SH, Meds and Allergies. -We provided refills for any medications we will prescribe as needed. -We  addressed current concerns per orders and patient instructions. -We have asked for records for pertinent exams, studies, vaccines and notes from previous providers. -We have advised patient to follow up per instructions below.  NECK PAIN -discussed proper posture, postural strengthening, proper sleep posture -discussed symptomatic treatment -new rx for lower dose of the flexeril sent per her request  GAD (generalized anxiety disorder) -supported, counseled, tx options discussed -encouraged to try CBT and number  to call provided -return precautions  BMI 33.0-33.9,adult -lifestyle recs -metabolic labs at CPE  -Patient advised to return or notify a doctor immediately if symptoms worsen or persist or new concerns arise.  Patient Instructions  BEFORE YOU LEAVE: -follow up: CPE in 6 months  For the neck: -make sure you have the right pillow -make sure you use proper posture when studying, doing computer work -postural stabilizing exercises for the neck -heat - try a rice sock! -topical sports creams with menthol or capsaicin -stop the gabapentin -use 5 mg flexeril as needed -also can alternate tylenol and aleve as needed - but try to use medicines a little as possible  We recommend the following healthy lifestyle for LIFE: 1) Small portions.   Tip: eat off of a salad plate instead of a dinner plate.  Tip: if you need more or a snack choose fruits, veggies and/or a handful of nuts or seeds.  2) Eat a healthy clean diet.  * Tip: Avoid (less then 1 serving per week): processed foods, sweets, sweetened drinks, white starches (rice, flour, bread, potatoes, pasta, etc), red meat, fast foods, butter  *Tip: CHOOSE instead   * 5-9 servings per day of fresh or frozen fruits and vegetables (but not corn, potatoes, bananas, canned or dried fruit)   *nuts and seeds, beans   *olives and olive oil   *small portions of lean meats such as fish and white chicken    *small portions of whole grains  3)Get at least 150 minutes of sweaty aerobic exercise per week.  4)Reduce stress - consider counseling, meditation and relaxation to balance other aspects of your life.     Colin Benton R.

## 2016-12-22 NOTE — Progress Notes (Signed)
Pre visit review using our clinic review tool, if applicable. No additional management support is needed unless otherwise documented below in the visit note. 

## 2016-12-22 NOTE — Patient Instructions (Addendum)
BEFORE YOU LEAVE: -follow up: CPE in 6 months  For the neck: -make sure you have the right pillow -make sure you use proper posture when studying, doing computer work -postural stabilizing exercises for the neck -heat - try a rice sock! -topical sports creams with menthol or capsaicin -stop the gabapentin -use 5 mg flexeril as needed -also can alternate tylenol and aleve as needed - but try to use medicines a little as possible  We recommend the following healthy lifestyle for LIFE: 1) Small portions.   Tip: eat off of a salad plate instead of a dinner plate.  Tip: if you need more or a snack choose fruits, veggies and/or a handful of nuts or seeds.  2) Eat a healthy clean diet.  * Tip: Avoid (less then 1 serving per week): processed foods, sweets, sweetened drinks, white starches (rice, flour, bread, potatoes, pasta, etc), red meat, fast foods, butter  *Tip: CHOOSE instead   * 5-9 servings per day of fresh or frozen fruits and vegetables (but not corn, potatoes, bananas, canned or dried fruit)   *nuts and seeds, beans   *olives and olive oil   *small portions of lean meats such as fish and white chicken    *small portions of whole grains  3)Get at least 150 minutes of sweaty aerobic exercise per week.  4)Reduce stress - consider counseling, meditation and relaxation to balance other aspects of your life.

## 2016-12-22 NOTE — Progress Notes (Addendum)
error 

## 2016-12-27 MED FILL — APRI 0.15MG/0.03MG 28 DAY: 0.15-30 | 84 days supply | Qty: 84 | Fill #1

## 2017-01-17 ENCOUNTER — Encounter: Payer: Self-pay | Admitting: Family Medicine

## 2017-02-27 ENCOUNTER — Telehealth: Payer: 59 | Admitting: Family

## 2017-02-27 DIAGNOSIS — J028 Acute pharyngitis due to other specified organisms: Secondary | ICD-10-CM | POA: Diagnosis not present

## 2017-02-27 DIAGNOSIS — B9689 Other specified bacterial agents as the cause of diseases classified elsewhere: Secondary | ICD-10-CM | POA: Diagnosis not present

## 2017-02-27 MED ORDER — BENZONATATE 100 MG PO CAPS: 100.0000 mg | ORAL_CAPSULE | Freq: Three times a day (TID) | ORAL | 0 refills | Status: DC | PRN

## 2017-02-27 MED ORDER — AZITHROMYCIN 250 MG PO TABS: ORAL_TABLET | ORAL | 0 refills | Status: DC

## 2017-02-27 MED FILL — BENZONATATE 100 MG CAP: 100 | 5 days supply | Qty: 30 | Fill #0

## 2017-02-27 MED FILL — AZITHROMYCIN 250 MG TAB: 250 | 5 days supply | Qty: 6 | Fill #0

## 2017-02-27 NOTE — Progress Notes (Signed)

## 2017-04-03 DIAGNOSIS — H52223 Regular astigmatism, bilateral: Secondary | ICD-10-CM | POA: Diagnosis not present

## 2017-04-12 MED FILL — APRI 0.15MG/0.03MG 28 DAY: 0.15-30 | 84 days supply | Qty: 84 | Fill #2

## 2017-07-26 ENCOUNTER — Encounter: Payer: Self-pay | Admitting: Family Medicine

## 2017-11-15 ENCOUNTER — Encounter: Payer: Self-pay | Admitting: Family Medicine

## 2018-08-08 ENCOUNTER — Ambulatory Visit (INDEPENDENT_AMBULATORY_CARE_PROVIDER_SITE_OTHER): Payer: 59 | Admitting: *Deleted

## 2018-08-08 DIAGNOSIS — Z23 Encounter for immunization: Secondary | ICD-10-CM | POA: Diagnosis not present

## 2018-09-23 MED FILL — ENSKYCE 0.15-30 MG-MCG TABS: 0.15-30 | 84 days supply | Qty: 84 | Fill #0

## 2018-11-08 LAB — HM PAP SMEAR: HM Pap smear: NEGATIVE

## 2018-12-17 ENCOUNTER — Encounter (HOSPITAL_COMMUNITY): Payer: Self-pay

## 2018-12-17 ENCOUNTER — Emergency Department (HOSPITAL_COMMUNITY)
Admission: EM | Admit: 2018-12-17 | Discharge: 2018-12-17 | Disposition: A | Discharge status: Home / Self Care | Payer: 59 | Attending: Emergency Medicine | Admitting: Emergency Medicine

## 2018-12-17 DIAGNOSIS — N3001 Acute cystitis with hematuria: Secondary | ICD-10-CM | POA: Insufficient documentation

## 2018-12-17 DIAGNOSIS — N3091 Cystitis, unspecified with hematuria: Secondary | ICD-10-CM

## 2018-12-17 DIAGNOSIS — N10 Acute tubulo-interstitial nephritis: Secondary | ICD-10-CM | POA: Insufficient documentation

## 2018-12-17 DIAGNOSIS — Z79899 Other long term (current) drug therapy: Secondary | ICD-10-CM | POA: Insufficient documentation

## 2018-12-17 LAB — URINALYSIS, ROUTINE W REFLEX MICROSCOPIC
Bilirubin Urine: NEGATIVE
Glucose, UA: NEGATIVE mg/dL
Ketones, ur: 5 mg/dL — AB
Nitrite: POSITIVE — AB
Protein, ur: >=300 mg/dL — AB
RBC / HPF: >50 RBC/hpf — ABNORMAL HIGH (ref 0–5)
SPECIFIC GRAVITY, URINE: 1.015 (ref 1.005–1.030)
pH: 5 (ref 5.0–8.0)

## 2018-12-17 LAB — PREGNANCY, URINE: PREG TEST UR: NEGATIVE

## 2018-12-17 MED ORDER — KETOROLAC TROMETHAMINE 30 MG/ML IJ SOLN
15.0000 mg | Freq: Once | INTRAMUSCULAR | Status: AC
  Administered 2018-12-17: 15 mg via INTRAVENOUS
  Filled 2018-12-17: qty 1

## 2018-12-17 MED ORDER — CEPHALEXIN 500 MG PO CAPS: 500.0000 mg | ORAL_CAPSULE | Freq: Four times a day (QID) | ORAL | 0 refills | Status: DC

## 2018-12-17 MED ORDER — OXYCODONE-ACETAMINOPHEN 5-325 MG PO TABS: 1.0000 | ORAL_TABLET | Freq: Four times a day (QID) | ORAL | 0 refills | Status: DC | PRN

## 2018-12-17 MED ORDER — OXYCODONE-ACETAMINOPHEN 5-325 MG PO TABS
1.0000 | ORAL_TABLET | Freq: Once | ORAL | Status: AC
  Administered 2018-12-17: 1 via ORAL
  Filled 2018-12-17: qty 1

## 2018-12-17 MED ORDER — CEFTRIAXONE SODIUM 2 G IJ SOLR
2.0000 g | Freq: Once | INTRAMUSCULAR | Status: AC
  Administered 2018-12-17: 2 g via INTRAVENOUS
  Filled 2018-12-17: qty 20

## 2018-12-17 MED ORDER — ONDANSETRON 4 MG PO TBDP: 4.0000 mg | ORAL_TABLET | Freq: Three times a day (TID) | ORAL | 0 refills | Status: DC | PRN

## 2018-12-17 MED ORDER — SODIUM CHLORIDE 0.9 % IV BOLUS
1000.0000 mL | Freq: Once | INTRAVENOUS | Status: AC
  Administered 2018-12-17: 1000 mL via INTRAVENOUS

## 2018-12-17 MED ORDER — IBUPROFEN 600 MG PO TABS: 600.0000 mg | ORAL_TABLET | Freq: Four times a day (QID) | ORAL | 0 refills | Status: DC | PRN

## 2018-12-17 MED ORDER — ONDANSETRON HCL 4 MG/2ML IJ SOLN
4.0000 mg | Freq: Once | INTRAMUSCULAR | Status: AC
  Administered 2018-12-17: 4 mg via INTRAVENOUS
  Filled 2018-12-17: qty 2

## 2018-12-17 MED FILL — ONDANSETRON ODT 4 MG TABLET: 4 | 3 days supply | Qty: 10 | Fill #0

## 2018-12-17 MED FILL — IBUPROFEN 600 MG TABLET: 600 | 7 days supply | Qty: 30 | Fill #0

## 2018-12-17 MED FILL — OXYCODONE-ACETAMINOPHEN 5-3: 5-325 | 1 days supply | Qty: 7 | Fill #0

## 2018-12-17 MED FILL — CEPHALEXIN 500 MG CAPSULE: 500 | 7 days supply | Qty: 28 | Fill #0

## 2018-12-17 NOTE — ED Notes (Signed)
Verbalized understanding discharge instructions, prescriptions, and follow-up. In no acute distress.   

## 2018-12-17 NOTE — Discharge Instructions (Signed)
Take Keflex as prescribed until finished.  Make sure you drink plenty of fluids to prevent dehydration.  Take 600 mg ibuprofen every 6 hours for management of pain.  You may supplement this with Percocet as needed.  Do not drive or drink alcohol after taking Percocet as it may make you drowsy and impair your judgment.  You have been prescribed Zofran to use as needed for nausea management.  Follow-up with your primary care doctor to ensure clearance of your urinary tract infection.  Return for new or concerning symptoms.

## 2018-12-17 NOTE — ED Provider Notes (Signed)
[facility] DEPT Provider Note   CSN: [id number] Arrival date & time: 12/17/18  0008     History   Chief Complaint Chief Complaint  Patient presents with  . Urinary Tract Infection    HPI Amber Mitchell is a 28 y.o. female.  28 year old female with history of anxiety presents to the emergency department for evaluation of urinary symptoms.  She reports 4 days of urinary frequency and urgency with dysuria.  She does not feel that she completely empties her bladder with voiding.  She began to notice nausea yesterday with sporadic vomiting.  Reports difficulty tolerating food and fluids by mouth secondary to emesis.  Has also developed a discomfort across her waist and into her back.  No medications taken prior to arrival for symptoms.  Abdominal surgical history significant for cholecystectomy.  The history is provided by the patient. No language interpreter was used.  Urinary Tract Infection    Past Medical History:  Diagnosis Date  . Anxiety 06/04/2012  . Chronic cholecystitis without calculus 2014/15   cholecystectomy  . Elevated liver enzymes    AST/ALT/Alk phos: noted 1 wk s/p cholecystectomy.  Needs repeating.  Marland Kitchen Headache(784.0)    no recent problems   . Neck pain   . Ovarian cyst, left 12/02/13   Noted on CT abd done for post op n/v and abd pain: ?signs of cyst rupture on CT    Patient Active Problem List   Diagnosis Date Noted  . BMI 33.0-33.9,adult 12/22/2016  . Knee pain, right 08/15/2012  . Headache(784.0) 06/17/2012  . Anxiety 06/04/2012  . NECK PAIN 12/31/2009    Past Surgical History:  Procedure Laterality Date  . CHOLECYSTECTOMY N/A 11/25/2013   Procedure: LAPAROSCOPIC CHOLECYSTECTOMY;  Surgeon: Gayland Curry, MD;  Location: [facility];  Service: General;  Laterality: N/A;     OB History   No obstetric history on file.      Home Medications    Prior to Admission medications   Medication Sig Start Date End Date  Taking? Authorizing Provider  acetaminophen (TYLENOL) 500 MG tablet Take 1,000 mg by mouth every 6 (six) hours as needed for mild pain, moderate pain or headache.   Yes [provider]  Cranberry-Vitamin C-Probiotic (AZO CRANBERRY PO) Take 2 tablets by mouth 3 (three) times daily.   Yes [provider]  desogestrel-ethinyl estradiol (JULEBER) 0.15-30 MG-MCG tablet Take 1 tablet by mouth daily.   Yes [provider]  cephALEXin (KEFLEX) 500 MG capsule Take 1 capsule (500 mg total) by mouth 4 (four) times daily. 12/17/18   Antonietta Breach, PA-C  ibuprofen (ADVIL,MOTRIN) 600 MG tablet Take 1 tablet (600 mg total) by mouth every 6 (six) hours as needed. 12/17/18   Antonietta Breach, PA-C  ondansetron (ZOFRAN ODT) 4 MG disintegrating tablet Take 1 tablet (4 mg total) by mouth every 8 (eight) hours as needed for nausea or vomiting. 12/17/18   Antonietta Breach, PA-C  oxyCODONE-acetaminophen (PERCOCET/ROXICET) 5-325 MG tablet Take 1-2 tablets by mouth every 6 (six) hours as needed for severe pain. 12/17/18   Antonietta Breach, PA-C    Family History Family History  Problem Relation Age of Onset  . Hypertension Maternal Grandmother   . Migraines Maternal Grandmother     Social History Social History   Tobacco Use  . Smoking status: Never Smoker  . Smokeless tobacco: Never Used  Substance Use Topics  . Alcohol use: No  . Drug use: No     Allergies  Patient has no known allergies.   Review of Systems Review of Systems Ten systems reviewed and are negative for acute change, except as noted in the HPI.    Physical Exam Updated Vital Signs BP 129/85   Pulse 93   Temp 98.8 F (37.1 C) (Oral)   Resp 16   Ht _0  (1.727 m)   Wt 104.3 kg   LMP 12/11/2018   SpO2 96%   BMI 34.97 kg/m   Physical Exam Vitals signs and nursing note reviewed.  Constitutional:      General: She is not in acute distress.    Appearance: She is well-developed. She is not diaphoretic.      Comments: Nontoxic appearing and in NAD  HENT:     Head: Normocephalic and atraumatic.  Eyes:     General: No scleral icterus.    Conjunctiva/sclera: Conjunctivae normal.  Neck:     Musculoskeletal: Normal range of motion.  Pulmonary:     Effort: Pulmonary effort is normal. No respiratory distress.     Comments: Respirations even and unlabored Musculoskeletal: Normal range of motion.  Skin:    General: Skin is warm and dry.     Coloration: Skin is not pale.     Findings: No erythema or rash.  Neurological:     Mental Status: She is alert and oriented to person, place, and time.     Coordination: Coordination normal.  Psychiatric:        Behavior: Behavior normal.      ED Treatments / Results  Labs (all labs ordered are listed, but only abnormal results are displayed) Labs Reviewed  URINALYSIS, ROUTINE W REFLEX MICROSCOPIC - Abnormal; Notable for the following components:      Result Value   Color, Urine AMBER (*)    APPearance CLOUDY (*)    Hgb urine dipstick MODERATE (*)    Ketones, ur 5 (*)    Protein, ur >=300 (*)    Nitrite POSITIVE (*)    Leukocytes, UA MODERATE (*)    RBC / HPF >50 (*)    WBC, UA >50 (*)    Bacteria, UA MANY (*)    All other components within normal limits  URINE CULTURE  PREGNANCY, URINE    EKG None  Radiology No results found.  Procedures Procedures (including critical care time)  Medications Ordered in ED Medications  oxyCODONE-acetaminophen (PERCOCET/ROXICET) 5-325 MG per tablet 1 tablet (has no administration in time range)  sodium chloride 0.9 % bolus 1,000 mL (0 mLs Intravenous Stopped 12/17/18 0549)  ondansetron (ZOFRAN) injection 4 mg (4 mg Intravenous Given 12/17/18 0403)  ketorolac (TORADOL) 30 MG/ML injection 15 mg (15 mg Intravenous Given 12/17/18 0403)  cefTRIAXone (ROCEPHIN) 2 g in sodium chloride 0.9 % 100 mL IVPB (0 g Intravenous Stopped 12/17/18 0456)  ketorolac (TORADOL) 30 MG/ML injection 15 mg (15 mg Intravenous  Given 12/17/18 0457)    5:58 AM Patient with no pain on repeat assessment. Tolerating PO water without nausea/vomiting.  6:07 AM [place] substance database reviewed. No Rx for narcotics filled. Low risk patient.   Initial Impression / Assessment and Plan / ED Course  I have reviewed the triage vital signs and the nursing notes.  Pertinent labs & imaging results that were available during my care of the patient were reviewed by me and considered in my medical decision making (see chart for details).     28 year old female presents to the emergency department for evaluation of urinary urgency, frequency, dysuria over  the past 4 days.  She has begun to experience pain in her waist as well as her low back with associated nausea and vomiting.  Clinically, symptoms consistent with pyelonephritis.  Urinalysis also consistent with infection.  The patient has had improvement in her tachycardia with IV fluids.  Pain has resolved following 30 mg IV Toradol.  She was given 2 g of Rocephin in the emergency department prior to discharge.  No present concern for SIRS or sepsis.  Tolerating PO fluids without difficulty.  Plan for discharge on Keflex.  The patient was referred to her primary care doctor to ensure resolution of her urinary tract infection.  Return precautions discussed and provided. Patient discharged in stable condition with no unaddressed concerns.   Vitals:   12/17/18 0048 12/17/18 0322 12/17/18 0430  BP: 130/89 129/85   Pulse: (!) 124 (!) 110 93  Resp: 18 16   Temp: 99.6 F (37.6 C) 98.8 F (37.1 C)   TempSrc: Oral Oral   SpO2: 98% 98% 96%  Weight: 104.3 kg    Height: _0  (1.727 m)      Final Clinical Impressions(s) / ED Diagnoses   Final diagnoses:  Pyelonephritis, acute  Cystitis with hematuria    ED Discharge Orders         Ordered    cephALEXin (KEFLEX) 500 MG capsule  4 times daily     12/17/18 0602    ondansetron (ZOFRAN ODT) 4 MG disintegrating tablet  Every 8  hours PRN     12/17/18 0602    ibuprofen (ADVIL,MOTRIN) 600 MG tablet  Every 6 hours PRN     12/17/18 0602    oxyCODONE-acetaminophen (PERCOCET/ROXICET) 5-325 MG tablet  Every 6 hours PRN     12/17/18 0602           Parker, [place], PA-C 12/17/18 2229    Fatima Blank, MD 12/17/18 [phone]

## 2018-12-17 NOTE — ED Notes (Signed)
Pain medication given. Patient advised about side effects of medications and to avoid driving for a minimum of 4 hours.   Pt reports she is calling her mother to pick her up.

## 2018-12-17 NOTE — ED Triage Notes (Signed)
Pt complains of urinary symptoms, frequency, painful and sometimes the urge but no urine for four days

## 2018-12-17 NOTE — ED Notes (Signed)
Pt given a Sprite and tolerating it well.

## 2018-12-19 LAB — URINE CULTURE: Culture: >=100000 — AB

## 2018-12-20 ENCOUNTER — Telehealth: Payer: Self-pay | Admitting: *Deleted

## 2018-12-20 NOTE — Telephone Encounter (Signed)
Post ED Visit - Positive Culture Follow-up  Culture report reviewed by antimicrobial stewardship pharmacist:  []  Enzo Bi, Pharm.D. []  Celedonio Miyamoto, Pharm.D., BCPS AQ-ID []  Garvin Fila, Pharm.D., BCPS []  Georgina Pillion, Pharm.D., BCPS []  Martin, [address].D., BCPS, AAHIVP []  Estella Husk, Pharm.D., BCPS, AAHIVP []  Lysle Pearl, PharmD, BCPS []  Phillips Climes, PharmD, BCPS []  Agapito Games, PharmD, BCPS []  Verlan Friends, PharmD Mervyn Gay, PharmD  Positive urine culture Treated with Cephalexin, organism sensitive to the same and no further patient follow-up is required at this time.  Virl Axe [facility] 12/20/2018, 12:21 PM

## 2019-03-19 MED FILL — ENSKYCE 0.15-30 MG-MCG TABS: 0.15-30 | 84 days supply | Qty: 84 | Fill #0

## 2019-09-16 ENCOUNTER — Telehealth: Payer: Self-pay | Admitting: Family Medicine

## 2019-09-16 ENCOUNTER — Telehealth: Payer: Self-pay | Admitting: *Deleted

## 2019-09-16 NOTE — Telephone Encounter (Signed)
Copied from [place] [id number]. Topic: General - Other >> Sep 16, 2019  8:10 AM Keene Breath wrote: Reason for CRM: Patient would like the nurse to call her regarding an insurance question.  Please advise and call patient to discuss at [phone]

## 2019-09-16 NOTE — Telephone Encounter (Signed)
I called the pt, she stated she has an appt with Dr Maudie Mercury on Thursday at 5:20pm for a physical and questioned if we accept her insurance.  I advised the pt call her insurance company to check for coverage.  Patient stated she needs a physical and TB skin test as soon as possible due to a new job.  I advised the pt Dr Maudie Mercury does not do physicals over the phone and she would need to schedule an establish care appointment first with a new primary care doctor of her choice as Dr Martinique, Dr Volanda Napoleon, Dr Ethlyn Gallery and Dr Jerilee Hoh are accepting new patients.  Patient stated to cancel the appt she was scheduled for and stated she will contact an urgent care.  Message sent to the office manager in order to have the call center be made aware of scheduling for Dr Maudie Mercury in the future.

## 2019-09-16 NOTE — Telephone Encounter (Signed)
The patients mom called back upset and we were going to work her in on Friday per Tish but the patient would prefer a Wednesday latest appointment available. [facility] only has virtual visits next Wednesday. Can you use her latest virtual slot?  Please advise

## 2019-09-16 NOTE — Telephone Encounter (Signed)
Left a detailed message asking that the pt call back with question she has.  CRM also created.

## 2019-09-17 NOTE — Telephone Encounter (Signed)
im ok with that

## 2019-09-18 ENCOUNTER — Telehealth: Payer: Self-pay | Admitting: Family Medicine

## 2019-09-18 NOTE — Telephone Encounter (Signed)
Patients mother refused to do a TOC visit.  She stated she wouldn't be back and hung up.

## 2019-09-18 NOTE — Telephone Encounter (Signed)
Noted  

## 2019-09-19 NOTE — Telephone Encounter (Signed)
error 

## 2020-04-26 ENCOUNTER — Other Ambulatory Visit: Payer: Self-pay | Admitting: Physician Assistant

## 2020-04-26 ENCOUNTER — Other Ambulatory Visit: Payer: Self-pay

## 2020-04-26 ENCOUNTER — Ambulatory Visit
Admission: RE | Admit: 2020-04-26 | Discharge: 2020-04-26 | Disposition: A | Discharge status: Home / Self Care | Payer: BLUE CROSS/BLUE SHIELD | Source: Ambulatory Visit | Attending: Physician Assistant | Admitting: Physician Assistant

## 2020-04-26 DIAGNOSIS — M79672 Pain in left foot: Secondary | ICD-10-CM

## 2024-01-20 ENCOUNTER — Ambulatory Visit (HOSPITAL_BASED_OUTPATIENT_CLINIC_OR_DEPARTMENT_OTHER)
Admission: EM | Admit: 2024-01-20 | Discharge: 2024-01-20 | Disposition: A | Discharge status: Home / Self Care | Attending: Family Medicine | Admitting: Family Medicine

## 2024-01-20 ENCOUNTER — Encounter (HOSPITAL_BASED_OUTPATIENT_CLINIC_OR_DEPARTMENT_OTHER): Payer: Self-pay

## 2024-01-20 DIAGNOSIS — J209 Acute bronchitis, unspecified: Secondary | ICD-10-CM

## 2024-01-20 MED ORDER — AZITHROMYCIN 250 MG PO TABS: 250.0000 mg | ORAL_TABLET | Freq: Every day | ORAL | 0 refills | Status: DC

## 2024-01-20 MED ORDER — PREDNISONE 20 MG PO TABS: 40.0000 mg | ORAL_TABLET | Freq: Every day | ORAL | 0 refills | Status: AC

## 2024-01-20 NOTE — ED Provider Notes (Signed)
 [facility]    CSN: [id number] Arrival date & time: 01/20/24  1415      History   Chief Complaint Chief Complaint  Patient presents with   noisy respirations    HPI Amber Mitchell is a 33 y.o. female.   33 year old female presents today with approximately 3 to 4 days of congestion, abnormal lung sounds.  Reports hearing "crackling" in chest.  Feels like when she lays on the sleep that the mucus is causing her to not able to breathe well.  No reported fevers, chills, body aches.  Did have mild headache.  Mild shortness of breath at times.     Past Medical History:  Diagnosis Date   Anxiety 06/04/2012   Chronic cholecystitis without calculus 2014/15   cholecystectomy   Elevated liver enzymes    AST/ALT/Alk phos: noted 1 wk s/p cholecystectomy.  Needs repeating.   Headache(784.0)    no recent problems    Neck pain    Ovarian cyst, left 12/02/13   Noted on CT abd done for post op n/v and abd pain: ?signs of cyst rupture on CT    Patient Active Problem List   Diagnosis Date Noted   BMI 33.0-33.9,adult 12/22/2016   Knee pain, right 08/15/2012   Headache 06/17/2012   Anxiety 06/04/2012   NECK PAIN 12/31/2009    Past Surgical History:  Procedure Laterality Date   CHOLECYSTECTOMY N/A 11/25/2013   Procedure: LAPAROSCOPIC CHOLECYSTECTOMY;  Surgeon: Atilano Ina, MD;  Location: [facility];  Service: General;  Laterality: N/A;    OB History   No obstetric history on file.      Home Medications    Prior to Admission medications   Medication Sig Start Date End Date Taking? Authorizing Provider  azithromycin (ZITHROMAX) 250 MG tablet Take 1 tablet (250 mg total) by mouth daily. Take first 2 tablets together, then 1 every day until finished. 01/20/24  Yes Kenderick Kobler A, FNP  predniSONE (DELTASONE) 20 MG tablet Take 2 tablets (40 mg total) by mouth daily with breakfast for 5 days. 01/20/24 01/25/24 Yes Haydyn Girvan A, FNP  acetaminophen (TYLENOL) 500 MG tablet  Take 1,000 mg by mouth every 6 (six) hours as needed for mild pain, moderate pain or headache.    [provider]  Cranberry-Vitamin C-Probiotic (AZO CRANBERRY PO) Take 2 tablets by mouth 3 (three) times daily.    [provider]  desogestrel-ethinyl estradiol (JULEBER) 0.15-30 MG-MCG tablet Take 1 tablet by mouth daily.    [provider]    Family History Family History  Problem Relation Age of Onset   Hypertension Maternal Grandmother    Migraines Maternal Grandmother     Social History Social History   Tobacco Use   Smoking status: Never   Smokeless tobacco: Never  Substance Use Topics   Alcohol use: No   Drug use: No     Allergies   Patient has no known allergies.   Review of Systems Review of Systems  HENT:  Positive for congestion.   Respiratory:  Positive for cough.      Physical Exam Triage Vital Signs ED Triage Vitals  Encounter Vitals Group     BP 01/20/24 1439 124/87     Systolic BP Percentile --      Diastolic BP Percentile --      Pulse Rate 01/20/24 1439 (!) 105     Resp 01/20/24 1439 20     Temp 01/20/24 1439 98.4 F (36.9 C)  Temp Source 01/20/24 1439 Oral     SpO2 01/20/24 1439 95 %     Weight --      Height --      Head Circumference --      Peak Flow --      Pain Score 01/20/24 1440 0     Pain Loc --      Pain Education --      Exclude from Growth Chart --    No data found.  Updated Vital Signs BP 124/87 (BP Location: Right Arm)   Pulse (!) 105   Temp 98.4 F (36.9 C) (Oral)   Resp 20   LMP 12/08/2023 (Approximate)   SpO2 95%   Visual Acuity Right Eye Distance:   Left Eye Distance:   Bilateral Distance:    Right Eye Near:   Left Eye Near:    Bilateral Near:     Physical Exam Constitutional:      General: She is not in acute distress.    Appearance: Normal appearance. She is not toxic-appearing.  HENT:     Right Ear: Tympanic membrane and ear canal normal.     Left Ear: Tympanic  membrane and ear canal normal.     Nose: Nose normal.     Mouth/Throat:     Pharynx: Oropharynx is clear.  Eyes:     Conjunctiva/sclera: Conjunctivae normal.  Cardiovascular:     Rate and Rhythm: Regular rhythm. Tachycardia present.     Heart sounds: Normal heart sounds.  Pulmonary:     Effort: Pulmonary effort is normal.     Breath sounds: Wheezing and rhonchi present.  Skin:    General: Skin is warm and dry.  Neurological:     Mental Status: She is alert.  Psychiatric:        Mood and Affect: Mood normal.      UC Treatments / Results  Labs (all labs ordered are listed, but only abnormal results are displayed) Labs Reviewed - No data to display  EKG   Radiology No results found.  Procedures Procedures (including critical care time)  Medications Ordered in UC Medications - No data to display  Initial Impression / Assessment and Plan / UC Course  I have reviewed the triage vital signs and the nursing notes.  Pertinent labs & imaging results that were available during my care of the patient were reviewed by me and considered in my medical decision making (see chart for details).     Acute bronchitis-treating with prednisone and azithromycin. Recommend Mucinex for cough, congestion to thin mucus. Push fluids Follow-up for any continued or worsening problems Final Clinical Impressions(s) / UC Diagnoses   Final diagnoses:  Acute bronchitis, unspecified organism     Discharge Instructions      Treating you for bronchitis.  Take the antibiotics and prednisone as prescribed.  Recommend over-the-counter Mucinex for cough, and congestion and clear mucus.  Make sure you are pushing fluids. Follow-up for any continued or worsening issues    ED Prescriptions     Medication Sig Dispense Auth. Provider   azithromycin (ZITHROMAX) 250 MG tablet Take 1 tablet (250 mg total) by mouth daily. Take first 2 tablets together, then 1 every day until finished. 6 tablet Allaina Brotzman,  Tiawanna Luchsinger A, FNP   predniSONE (DELTASONE) 20 MG tablet Take 2 tablets (40 mg total) by mouth daily with breakfast for 5 days. 10 tablet Janace Aris, FNP      PDMP not reviewed this encounter.  Janace Aris, FNP 01/20/24 1515

## 2024-01-20 NOTE — Discharge Instructions (Addendum)
 Treating you for bronchitis.  Take the antibiotics and prednisone as prescribed.  Recommend over-the-counter Mucinex for cough, and congestion and clear mucus.  Make sure you are pushing fluids. Follow-up for any continued or worsening issues

## 2024-01-20 NOTE — ED Triage Notes (Signed)
 No hx of asthma. Denies runny nose, denies fever. States when taking a deep breath, she hears crackling and popping coming from her lungs. Has noticed this x 2 days. States feels as if she needs to cough or clear throat often to "break it loose". Denies pain with deep breath. Faint expiratory wheeze noted to lower lung fields.

## 2024-09-25 ENCOUNTER — Other Ambulatory Visit (HOSPITAL_BASED_OUTPATIENT_CLINIC_OR_DEPARTMENT_OTHER): Payer: Self-pay

## 2024-09-25 ENCOUNTER — Encounter (HOSPITAL_BASED_OUTPATIENT_CLINIC_OR_DEPARTMENT_OTHER): Payer: Self-pay | Admitting: Student

## 2024-09-25 ENCOUNTER — Ambulatory Visit (HOSPITAL_BASED_OUTPATIENT_CLINIC_OR_DEPARTMENT_OTHER): Admitting: Student

## 2024-09-25 VITALS — BP 120/83 | HR 100 | Temp 97.9°F | Resp 16 | Ht 68.11 in | Wt 290.8 lb

## 2024-09-25 DIAGNOSIS — G479 Sleep disorder, unspecified: Secondary | ICD-10-CM | POA: Diagnosis not present

## 2024-09-25 DIAGNOSIS — R519 Headache, unspecified: Secondary | ICD-10-CM | POA: Insufficient documentation

## 2024-09-25 DIAGNOSIS — Z6841 Body Mass Index (BMI) 40.0 and over, adult: Secondary | ICD-10-CM

## 2024-09-25 DIAGNOSIS — Z23 Encounter for immunization: Secondary | ICD-10-CM

## 2024-09-25 DIAGNOSIS — Z7689 Persons encountering health services in other specified circumstances: Secondary | ICD-10-CM

## 2024-09-25 DIAGNOSIS — G43109 Migraine with aura, not intractable, without status migrainosus: Secondary | ICD-10-CM | POA: Diagnosis not present

## 2024-09-25 MED ORDER — AMITRIPTYLINE HCL 50 MG PO TABS
50.0000 mg | ORAL_TABLET | Freq: Every day | ORAL | 2 refills | Status: DC
  Filled 2024-09-25: qty 30, 30d supply, fill #0

## 2024-09-25 MED ORDER — SUMATRIPTAN SUCCINATE 25 MG PO TABS
25.0000 mg | ORAL_TABLET | ORAL | 3 refills | Status: AC | PRN
  Filled 2024-09-25: qty 9, 15d supply, fill #0

## 2024-09-25 NOTE — Patient Instructions (Addendum)
 It was nice to see you today!  Amitriptyline is a Tricyclic Anti Depressant medication, which can be used for multiple conditions other than depression. This medication is typically taken at night time before bed. We typically start at low dose and gradually increase dose as necessary. Like many other similar medications, it carries risk of suicidality in certain high risk population. It can cause drowsiness, weight gain and many other anti cholinergic side effects. Pt should not stop it abruptly.  Please make sure that you take the imitrex (sumatriptan) at the first onset of the migraine.  If you have any problems before your next visit feel free to message me via MyChart (minor issues or questions) or call the office, otherwise you may reach out to schedule an office visit.  Thank you! Dorice Stiggers, PA-C

## 2024-09-25 NOTE — Progress Notes (Signed)
 New Patient Office Visit  Subjective    Patient ID: Amber Mitchell, female    DOB: 1991/04/22  Age: 33 y.o. MRN: [id number]  CC:  Chief Complaint  Patient presents with   Establish Care    Here to establish care.   Headache    Headaches since age of 32. Did get eye glasses and has had some suggestions from a primary care provider in [place]. Unsure of their name.   Mass    Bump on LUQ. Looked like a bump at first but now it is scabby. It got harder and bigger. Been there 3-4 weeks.    Anxiety    Would like to discuss anxiety and if headaches could be related.    Discussed the use of AI scribe software for clinical note transcription with the patient, who gave verbal consent to proceed.  History of Present Illness   Amber Mitchell is a 33 year old female who presents for establishment of care with chronic headaches and migraines.  She has experienced headaches since the age of 78 or 74, coinciding with her initial diagnosis of anxiety. Despite obtaining glasses, she continues to suffer from daily headaches and frequent migraines. Headaches occur daily, while migraines occur weekly or biweekly, sometimes monthly. Migraines are associated with photophobia, phonophobia, nausea, and require her to sleep for extended periods due to the severity of symptoms.  She has tried various over-the-counter medications such as Tylenol , ibuprofen , Excedrin, and other migraine-specific treatments, but these provide only temporary relief. Headaches often return once the medication wears off. She occasionally wakes up with headaches and experiences them primarily in the right temple, while migraines are felt in the occipital region or right temple with throbbing pain.  She experiences visual auras described as 'squiggles' or 'fireworks' in her vision before migraines. There is no history of numbness in her face, arms, or legs. No history of diabetes or hyperlipidemia.  Her sleep is  poor, averaging 3-5 hours per night, with frequent awakenings. She lives alone and has no known history of snoring or observed apneic episodes. Her weight has remained stable over time.  She has a positive depression screening and has mentioned anxiety, questioning if it is related to her headaches. 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      Screenings:  Colon Cancer: [facility] in mother and grandmother. Early screening uncecessary. Grandmother- adenocarcinoma of the colon. MGM- carcinoma of the ampulla of vater. Lung Cancer: no smoking or vaping Breast Cancer: no fmh Diabetes: indicated HLD: indicated      09/25/2024    3:01 PM  GAD 7 : Generalized Anxiety Score  Nervous, Anxious, on Edge 2  Control/stop worrying 2  Worry too much - different things 2  Trouble relaxing 1  Restless 2  Easily annoyed or irritable 0  Afraid - awful might happen 0  Total GAD 7 Score 9  Anxiety Difficulty Somewhat difficult   Outpatient Encounter Medications as of 09/25/2024  Medication Sig   amitriptyline (ELAVIL) 50 MG tablet Take 1 tablet (50 mg total) by mouth at bedtime.   SUMAtriptan (IMITREX) 25 MG tablet Take 1 tablet (25 mg total) by mouth every 2 (two) hours as needed for migraine. May repeat in 2 hours if headache persists or recurs.   [DISCONTINUED] acetaminophen  (TYLENOL ) 500 MG tablet Take 1,000 mg by mouth every 6 (six) hours as needed for mild pain, moderate pain or headache.   [DISCONTINUED] azithromycin  (ZITHROMAX )  250 MG tablet Take 1 tablet (250 mg total) by mouth daily. Take first 2 tablets together, then 1 every day until finished.   [DISCONTINUED] Cranberry-Vitamin C-Probiotic (AZO CRANBERRY PO) Take 2 tablets by mouth 3 (three) times daily.   [DISCONTINUED] desogestrel-ethinyl estradiol (JULEBER) 0.15-30 MG-MCG tablet Take 1 tablet by mouth daily.   No facility-administered encounter medications on file as of 09/25/2024.    Past  Medical History:  Diagnosis Date   Anxiety 06/04/2012   Chronic cholecystitis without calculus 2014/15   cholecystectomy   Elevated liver enzymes    AST/ALT/Alk phos: noted 1 wk s/p cholecystectomy.  Needs repeating.   Headache(784.0)    no recent problems    Neck pain    Ovarian cyst, left 12/02/13   Noted on CT abd done for post op n/v and abd pain: ?signs of cyst rupture on CT    Past Surgical History:  Procedure Laterality Date   CHOLECYSTECTOMY N/A 11/25/2013   Procedure: LAPAROSCOPIC CHOLECYSTECTOMY;  Surgeon: Amber CHRISTELLA Blush, MD;  Location: [facility];  Service: General;  Laterality: N/A;    Family History  Problem Relation Age of Onset   Colon cancer Mother    Pancreatic cancer Father    Healthy Sister    Gallbladder disease Sister        gallbladder removed   Hypertension Maternal Grandmother    Migraines Maternal Grandmother    Colon cancer Maternal Grandmother    Gout Maternal Grandmother    Atrial fibrillation Maternal Grandmother    COPD Maternal Grandfather     Social History   Socioeconomic History   Marital status: Single    Spouse name: Not on file   Number of children: 0   Years of education: Not on file   Highest education level: Not on file  Occupational History   Not on file  Tobacco Use   Smoking status: Never    Passive exposure: Never   Smokeless tobacco: Never  Vaping Use   Vaping status: Never Used  Substance and Sexual Activity   Alcohol use: No   Drug use: No   Sexual activity: Not Currently  Other Topics Concern   Not on file  Social History Narrative   Not on file   Social Drivers of Health   Financial Resource Strain: Not on file  Food Insecurity: No Food Insecurity (09/25/2024)   Hunger Vital Sign    Worried About Running Out of Food in the Last Year: Never true    Ran Out of Food in the Last Year: Never true  Transportation Needs: No Transportation Needs (09/25/2024)   PRAPARE - Administrator, Civil Service  (Medical): No    Lack of Transportation (Non-Medical): No  Physical Activity: Not on file  Stress: Not on file  Social Connections: Not on file  Intimate Partner Violence: Not At Risk (09/25/2024)   Humiliation, Afraid, Rape, and Kick questionnaire    Fear of Current or Ex-Partner: No    Emotionally Abused: No    Physically Abused: No    Sexually Abused: No    ROS  Per HPI      Objective    BP 120/83   Pulse 100   Temp 97.9 F (36.6 C) (Oral)   Resp 16   Ht 5' 8.11 (1.73 m)   Wt 290 lb 12.8 oz (131.9 kg)   LMP 06/06/2024 (Approximate)   SpO2 96%   BMI 44.07 kg/m   Physical Exam Constitutional:  General: She is not in acute distress.    Appearance: Normal appearance. She is not ill-appearing.  HENT:     Head: Normocephalic and atraumatic.     Right Ear: External ear normal.     Left Ear: External ear normal.     Nose: Nose normal.  Eyes:     General: No scleral icterus.    Conjunctiva/sclera: Conjunctivae normal.  Cardiovascular:     Rate and Rhythm: Normal rate and regular rhythm.     Heart sounds: Normal heart sounds. No murmur heard.    No friction rub.  Pulmonary:     Effort: Pulmonary effort is normal. No respiratory distress.     Breath sounds: Normal breath sounds. No wheezing, rhonchi or rales.  Musculoskeletal:        General: Normal range of motion.  Skin:    General: Skin is warm and dry.     Coloration: Skin is not jaundiced or pale.     Findings: Lesion (small lesion that was beginning to peel off, stuck on sort of appearance like a seb keratosis) present.  Neurological:     General: No focal deficit present.     Mental Status: She is alert.  Psychiatric:        Mood and Affect: Mood normal.        Behavior: Behavior normal.         Assessment & Plan:   Assessment and Plan    Encounter to establish care  Migraine with aura and frequent headaches Chronic issue, not at goal.  Chronic daily headaches with weekly to biweekly  migraines, characterized by throbbing pain, photophobia, phonophobia, nausea, and visual aura. Headaches are not relieved by over-the-counter medications. Possible contributing factors include stress and potential sleep apnea. Differential includes sleep apnea and lack of sleep.  Patient states no chance of pregnancy. - Prescribed sumatriptan (Imitrex) for acute migraine attacks, 15 tablets with instructions to use at the first onset of migraine with aura. - Prescribed amitriptyline 50 mg at bedtime for headache prevention and sleep improvement. - Recommended a headache diary to track headache frequency, triggers, and medication efficacy. - Discussed potential sleep study to evaluate for sleep apnea.  Sleep disturbance Reports poor sleep quality with difficulty falling asleep and frequent awakenings. Possible contributing factors include stress and potential sleep apnea. - Discussed potential sleep study to evaluate for sleep apnea. - Prescribed amitriptyline 50 mg at bedtime to aid sleep.  Depression and anxiety Positive depression screening and anxiety possibly contributing to headache frequency. Stress identified as a potential trigger for headaches and migraines. - Prescribed amitriptyline 50 mg at bedtime to aid with mood and anxiety.  Seborrheic keratosis, abdominal skin Painful lesion on the abdomen, initially a red bump that became scabby and is growing.  Suspected seborrheic keratosis. - Advised to return if lesion recurs for potential cryotherapy with liquid nitrogen. - Provided Band-Aid to protect the area from friction.      Return in about 6 weeks (around 11/07/2024) for HA med start.   Winnie Umali T Johnel Yielding, PA-C

## 2024-11-07 ENCOUNTER — Ambulatory Visit (HOSPITAL_BASED_OUTPATIENT_CLINIC_OR_DEPARTMENT_OTHER): Admitting: Student

## 2024-11-07 ENCOUNTER — Encounter (HOSPITAL_BASED_OUTPATIENT_CLINIC_OR_DEPARTMENT_OTHER): Payer: Self-pay | Admitting: Student

## 2024-11-07 ENCOUNTER — Telehealth (HOSPITAL_BASED_OUTPATIENT_CLINIC_OR_DEPARTMENT_OTHER): Payer: Self-pay

## 2024-11-07 ENCOUNTER — Other Ambulatory Visit (HOSPITAL_BASED_OUTPATIENT_CLINIC_OR_DEPARTMENT_OTHER): Payer: Self-pay

## 2024-11-07 ENCOUNTER — Other Ambulatory Visit (HOSPITAL_COMMUNITY): Payer: Self-pay

## 2024-11-07 ENCOUNTER — Telehealth (HOSPITAL_BASED_OUTPATIENT_CLINIC_OR_DEPARTMENT_OTHER): Payer: Self-pay | Admitting: Pharmacy Technician

## 2024-11-07 VITALS — BP 121/84 | HR 97 | Temp 97.8°F | Resp 16 | Ht 68.11 in | Wt 297.3 lb

## 2024-11-07 DIAGNOSIS — G43E09 Chronic migraine with aura, not intractable, without status migrainosus: Secondary | ICD-10-CM | POA: Diagnosis not present

## 2024-11-07 DIAGNOSIS — F5101 Primary insomnia: Secondary | ICD-10-CM | POA: Diagnosis not present

## 2024-11-07 MED ORDER — NURTEC 75 MG PO TBDP
75.0000 mg | ORAL_TABLET | ORAL | 3 refills | Status: AC
  Filled 2024-11-07: qty 15, 30d supply, fill #0

## 2024-11-07 NOTE — Telephone Encounter (Signed)
 Medication: Nurtec 75mg  Able to fill? No Prior authorization required? Yes Co-pay before assistance: n/a

## 2024-11-07 NOTE — Telephone Encounter (Signed)
 To proceed with the prior authorization for Nurtec, we will need to attach chart notes from the most recent office visit. Once the notes are signed, we can submit the PA

## 2024-11-07 NOTE — Patient Instructions (Addendum)
 It was nice to see you today!  As we discussed in clinic:  Please call [facility] to set up your papsmear at: Address: [address], [place], [place] [address] Phone: [phone]  Take magnesium glycinate 400 mg nightly about 30 minutes before bed time.  If you have any problems before your next visit feel free to message me via MyChart (minor issues or questions) or call the office, otherwise you may reach out to schedule an office visit.  Thank you! Debroah Shuttleworth, PA-C

## 2024-11-07 NOTE — Telephone Encounter (Signed)
 PA request has been Received. New Encounter has been or will be created for follow up. For additional info see Pharmacy Prior Auth telephone encounter from 11/07/24.

## 2024-11-07 NOTE — Progress Notes (Addendum)
 "  Established Patient Office Visit  Subjective   Patient ID: Amber Mitchell, female    DOB: 1991/01/11  Age: 34 y.o. MRN: [id number]  Chief Complaint  Patient presents with   Medical Management of Chronic Issues    Follow up.    HPI  Discussed the use of AI scribe software for clinical note transcription with the patient, who gave verbal consent to proceed.  History of Present Illness   Amber Mitchell is a 34 year old female with chronic migraines who presents for follow-up.  She experiences approximately 15+ migraines per month, some with aura. Loud noises and bright lights are known triggers. She sometimes experiences multiple migraines in one day and occasionally misses documenting them, especially if they occur close to bedtime.  She started taking amitriptyline  50 mg at bedtime on December 26th. She experiences nausea upon waking and has not noticed a significant decrease in the frequency of her migraines. She has experienced a week without headaches in the past, but this was not attributed to the amitriptyline . The medication affects her sleep, causing frequent awakenings and feeling unrested despite extended sleep periods.  For acute migraine relief, she uses sumatriptan  as needed, which helps reduce the severity of her headaches but does not completely resolve them. She sometimes requires a second dose if the initial dose is insufficient. She tries to avoid medication for milder headaches rated 1-5 on a pain scale to prevent overuse.  She has not tried other medications for migraine prevention in the past. She mentions a slight increase in motivation since starting amitriptyline , but no significant mood changes.  Her sleep is disrupted, with frequent awakenings due to noise or disturbances, and she struggles to maintain restful sleep, often feeling the need to sleep longer.  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      Patient  Active Problem List   Diagnosis Date Noted   Migraine with aura and without status migrainosus, not intractable 09/25/2024   Frequent headaches 09/25/2024   BMI 40.0-44.9, adult (HCC) 09/25/2024   BMI 33.0-33.9,adult 12/22/2016   Knee pain, right 08/15/2012   Headache 06/17/2012   Anxiety 06/04/2012   NECK PAIN 12/31/2009   Past Medical History:  Diagnosis Date   Anxiety 06/04/2012   Chronic cholecystitis without calculus 2014/15   cholecystectomy   Elevated liver enzymes    AST/ALT/Alk phos: noted 1 wk s/p cholecystectomy.  Needs repeating.   Headache(784.0)    no recent problems    Neck pain    Ovarian cyst, left 12/02/13   Noted on CT abd done for post op n/v and abd pain: ?signs of cyst rupture on CT   Social History[1] Allergies[2]    ROS Per HPI.    Objective:     BP 121/84   Pulse 97   Temp 97.8 F (36.6 C) (Oral)   Resp 16   Ht 5' 8.11 (1.73 m)   Wt 297 lb 4.8 oz (134.9 kg)   LMP 07/06/2024 (Approximate)   SpO2 97%   BMI 45.06 kg/m  BP Readings from Last 3 Encounters:  11/07/24 121/84  09/25/24 120/83  01/20/24 124/87   Wt Readings from Last 3 Encounters:  11/07/24 297 lb 4.8 oz (134.9 kg)  09/25/24 290 lb 12.8 oz (131.9 kg)  12/17/18 230 lb (104.3 kg)   SpO2 Readings from Last 3 Encounters:  11/07/24 97%  09/25/24 96%  01/20/24 95%      Physical Exam Constitutional:  General: She is not in acute distress.    Appearance: Normal appearance. She is not ill-appearing.  HENT:     Head: Normocephalic and atraumatic.     Nose: Nose normal.  Eyes:     General: No scleral icterus.    Conjunctiva/sclera: Conjunctivae normal.  Cardiovascular:     Rate and Rhythm: Normal rate and regular rhythm.     Heart sounds: Normal heart sounds. No murmur heard.    No friction rub.  Pulmonary:     Effort: Pulmonary effort is normal. No respiratory distress.     Breath sounds: Normal breath sounds. No wheezing, rhonchi or rales.  Musculoskeletal:         General: Normal range of motion.  Skin:    General: Skin is warm and dry.     Coloration: Skin is not jaundiced or pale.  Neurological:     General: No focal deficit present.     Mental Status: She is alert.  Psychiatric:        Mood and Affect: Mood normal.        Behavior: Behavior normal.      Results for orders placed or performed in visit on 11/07/24  HM PAP SMEAR  Result Value Ref Range   HM Pap smear negative     Last CBC Lab Results  Component Value Date   WBC 10.6 (H) 12/07/2016   HGB 13.3 12/07/2016   HCT 40.5 12/07/2016   MCV 80.1 12/07/2016   MCH 27.2 12/18/2013   RDW 13.9 12/07/2016   PLT 380.0 12/07/2016   Last metabolic panel Lab Results  Component Value Date   GLUCOSE 82 12/07/2016   NA 138 12/07/2016   K 4.1 12/07/2016   CL 105 12/07/2016   CO2 25 12/07/2016   BUN 13 12/07/2016   CREATININE 0.73 12/07/2016   GFR 104.07 12/07/2016   CALCIUM 9.2 12/07/2016   PHOS 3.4 06/03/2012   PROT 7.3 12/07/2016   ALBUMIN 4.2 12/07/2016   BILITOT 0.3 12/07/2016   ALKPHOS 77 12/07/2016   AST 19 12/07/2016   ALT 60 (H) 12/07/2016   Last lipids Lab Results  Component Value Date   CHOL 156 06/03/2012   HDL 46.20 06/03/2012   LDLCALC 90 06/03/2012   TRIG 98.0 06/03/2012   CHOLHDL 3 06/03/2012   Last hemoglobin A1c No results found for: HGBA1C    The ASCVD Risk score (Arnett DK, et al., 2019) failed to calculate for the following reasons:   The 2019 ASCVD risk score is only valid for ages 8 to 34   * - Cholesterol units were assumed    Assessment & Plan:   Assessment and Plan    Chronic migraine with aura Chronic, not at goal. Chronic migraines with aura occurring more than twice weekly, with approximately 15 + episodes in December. Amitriptyline  50 mg at bedtime has not provided significant relief and may be contributing to worsened sleep it seems. Sumatriptan  provides partial relief but does not completely resolve headaches. Considering  Nurtec for prevention due to frequency of migraines. - Discontinued amitriptyline  due to SE and intolerance. - Initiated Nurtec 75 mg every other day for migraine prevention. - Continue sumatriptan  25 mg prn. - Provided sample of Nurtec if available- Lot: [id number]. EXP: 04/2027. - Will consider switching sumatriptan  to rizatriptan or another triptan at next visit if needed. - Encouraged tracking of migraine frequency and severity with continued HA diary  Prophylactic meds tried at this time: amitriptyline .  Abortive meds tried:  Sumatriptan .  Insomnia Likely exacerbated by amitriptyline , causing excessive sleepiness but difficulty maintaining sleep. Magnesium glycinate suggested to improve sleep quality and potentially aid in migraine management. - Started magnesium glycinate 400 mg nightly 30 minutes before bedtime.  General Health Maintenance HPV vaccination series completed with two doses, which is considered adequate coverage. Pap smear scheduled at [facility]. Eye examination recommended to assess for potential ocular causes of headaches. - Ensure completion of Pap smear at [facility]. - Schedule eye examination to evaluate the backs of the eyes.      Return in about 3 months (around 02/05/2025) for chronic migraines.    Xaria Judon T Matricia Begnaud, PA-C     [1]  Social History Tobacco Use   Smoking status: Never    Passive exposure: Never   Smokeless tobacco: Never  Vaping Use   Vaping status: Never Used  Substance Use Topics   Alcohol use: No   Drug use: No  [2] No Known Allergies  "

## 2024-11-10 ENCOUNTER — Other Ambulatory Visit (HOSPITAL_COMMUNITY): Payer: Self-pay

## 2024-11-10 NOTE — Telephone Encounter (Signed)
 Pharmacy Patient Advocate Encounter   Received notification from Pt Calls Messages that prior authorization for Nurtec 75MG  dispersible tablets  is required/requested.   Insurance verification completed.   The patient is insured through [facility]   Per test claim: PA required; PA submitted to above mentioned insurance via Latent Key/confirmation #/EOC [id number] Status is pending

## 2024-11-19 ENCOUNTER — Encounter (HOSPITAL_BASED_OUTPATIENT_CLINIC_OR_DEPARTMENT_OTHER): Payer: Self-pay | Admitting: Student

## 2024-11-20 NOTE — Telephone Encounter (Signed)
 Please see mychart message sent by pt and advise.

## 2025-02-09 ENCOUNTER — Ambulatory Visit (HOSPITAL_BASED_OUTPATIENT_CLINIC_OR_DEPARTMENT_OTHER): Admitting: Student
